# Patient Record
Sex: Female | Born: 1935 | Race: White | Hispanic: No | Marital: Married | Smoking: Never smoker
Health system: Southern US, Community
[De-identification: ages and names within clinical notes are randomized; demographics above are authoritative.]

## PROBLEM LIST (undated history)

## (undated) DIAGNOSIS — I341 Nonrheumatic mitral (valve) prolapse: Secondary | ICD-10-CM

## (undated) DIAGNOSIS — I10 Essential (primary) hypertension: Secondary | ICD-10-CM

## (undated) DIAGNOSIS — E119 Type 2 diabetes mellitus without complications: Secondary | ICD-10-CM

## (undated) DIAGNOSIS — F419 Anxiety disorder, unspecified: Secondary | ICD-10-CM

## (undated) DIAGNOSIS — N181 Chronic kidney disease, stage 1: Secondary | ICD-10-CM

## (undated) DIAGNOSIS — Z8619 Personal history of other infectious and parasitic diseases: Secondary | ICD-10-CM

## (undated) HISTORY — PX: CHOLECYSTECTOMY: SHX55

## (undated) HISTORY — DX: Essential (primary) hypertension: I10

## (undated) HISTORY — DX: Chronic kidney disease, stage 1: N18.1

## (undated) HISTORY — DX: Anxiety disorder, unspecified: F41.9

## (undated) HISTORY — PX: COLON SURGERY: SHX602

## (undated) HISTORY — PX: APPENDECTOMY: SHX54

## (undated) HISTORY — DX: Type 2 diabetes mellitus without complications: E11.9

---

## 2001-08-16 ENCOUNTER — Ambulatory Visit (HOSPITAL_COMMUNITY): Admission: RE | Admit: 2001-08-16 | Discharge: 2001-08-16 | Payer: Self-pay | Admitting: Internal Medicine

## 2001-08-17 ENCOUNTER — Encounter: Payer: Self-pay | Admitting: Internal Medicine

## 2001-08-17 ENCOUNTER — Ambulatory Visit (HOSPITAL_COMMUNITY): Admission: RE | Admit: 2001-08-17 | Discharge: 2001-08-17 | Payer: Self-pay | Admitting: Internal Medicine

## 2001-09-06 ENCOUNTER — Ambulatory Visit (HOSPITAL_COMMUNITY): Admission: RE | Admit: 2001-09-06 | Discharge: 2001-09-06 | Payer: Self-pay | Admitting: *Deleted

## 2001-09-22 ENCOUNTER — Encounter: Payer: Self-pay | Admitting: *Deleted

## 2001-09-22 ENCOUNTER — Ambulatory Visit (HOSPITAL_COMMUNITY): Admission: RE | Admit: 2001-09-22 | Discharge: 2001-09-22 | Payer: Self-pay | Admitting: *Deleted

## 2001-10-10 ENCOUNTER — Inpatient Hospital Stay (HOSPITAL_COMMUNITY): Admission: AD | Admit: 2001-10-10 | Discharge: 2001-10-14 | Payer: Self-pay | Admitting: General Surgery

## 2001-10-10 ENCOUNTER — Encounter: Payer: Self-pay | Admitting: General Surgery

## 2001-12-20 ENCOUNTER — Ambulatory Visit (HOSPITAL_COMMUNITY): Admission: RE | Admit: 2001-12-20 | Discharge: 2001-12-20 | Payer: Self-pay | Admitting: Internal Medicine

## 2001-12-20 ENCOUNTER — Encounter: Payer: Self-pay | Admitting: Internal Medicine

## 2002-12-25 ENCOUNTER — Ambulatory Visit (HOSPITAL_COMMUNITY): Admission: RE | Admit: 2002-12-25 | Discharge: 2002-12-25 | Payer: Self-pay | Admitting: Internal Medicine

## 2002-12-25 ENCOUNTER — Encounter: Payer: Self-pay | Admitting: Internal Medicine

## 2003-06-17 IMAGING — CT CT ABDOMEN W/ CM
1 of 2 series · 13 of 32 positions shown, 19 images · IV contrast (CONTRAST)
Comparison: none

FINDINGS
CLINICAL DATA: CECAL MASS, ABDOMINAL / PELVIC PAIN
CT ABDOMEN AND PELVIS WITH CONTRAST
MULTIPLE CONTIGUOUS AXIAL IMAGES WERE OBTAINED FROM THE LUNG BASES THROUGH THE ABDOMEN AND PELVIS
AT 5 MM COLLIMATION FOLLOWING ADMINISTRATION OF ORAL CONTRAST AND 100 CC OF INTRAVENOUS OMNIPAQUE
300.  NO IMMEDIATE ADVERSE REACTION.   NO COMPARISON FILMS AVAILABLE.
CT ABDOMEN:
MINIMAL INTRAHEPATIC BILIARY FULLNESS IS NOTED.  REMAINDER OF LIVER UNREMARKABLE.  THE SPLEEN,
ADRENAL GLANDS, AND PANCREAS ARE UNREMARKABLE. MILD CORTICAL RENAL THINNING IS NOTED BILATERALLY,
BUT REMAINDER OF KIDNEYS UNREMARKABLE.  NO FREE FLUID OR ENLARGED LYMPH NODES.  MULTIPLE SMALL
GALLSTONES ARE NOTED WITHIN THE GALLBLADDER WHICH IS OTHERWISE UNREMARKABLE.
IMPRESSION
1.  CHOLELITHIASIS WITHOUT EVIDENCE OF CHOLECYSTITIS.
2.  MINIMAL INTRAHEPATIC BILIARY FULLNESS, UPPER LIMITS NORMAL.  NO DEFINITE HEPATIC MASSES.
3.  MILD CORTICAL RENAL THINNING.
CT PELVIS:
THE CECUM IS IRREGULAR AND MAY CORRESPOND TO THE PATIENT'S KNOWN CECAL MASS.  NO DEFINITE EVIDENCE
OF ENLARGED LYMPH NODES.  SMALL TO MODERATE AMOUNT OF FREE FLUID IN THE POSTERIOR INFERIOR PELVIS.
BLADDER GROSSLY UNREMARKABLE.  REMAINDER OF BOWEL GROSSLY UNREMARKABLE.
1.  MILD IRREGULARITY OF THE CECUM LIKELY  REPRESENTING KNOWN CECAL MASS.  NO DEFINITE EVIDENCE OF
LYMPHADENOPATHY.
2.  SMALL TO MODERATE AMOUNT OF FREE FLUID IN THE LOWER PELVIS.

[Series 2581: — · axial · 0.62mm/px · z∈[+1226,+1566]mm · 13 of 80 slices shown, 19 images]
[im 6/80  soft-tissue]
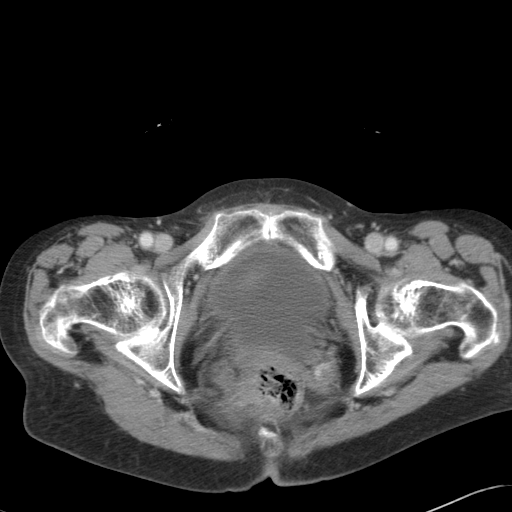
[im 6/80  bone]
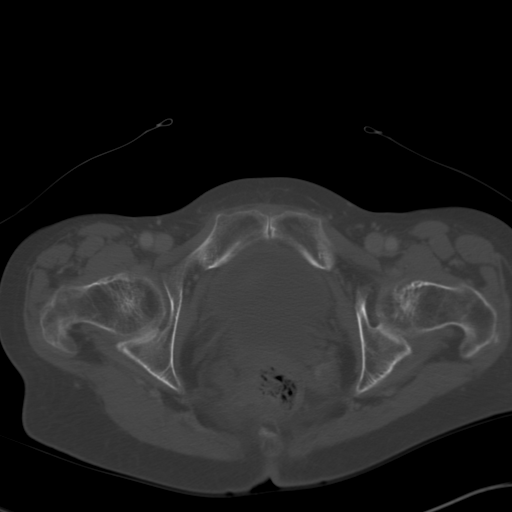
[im 11/80  soft-tissue]
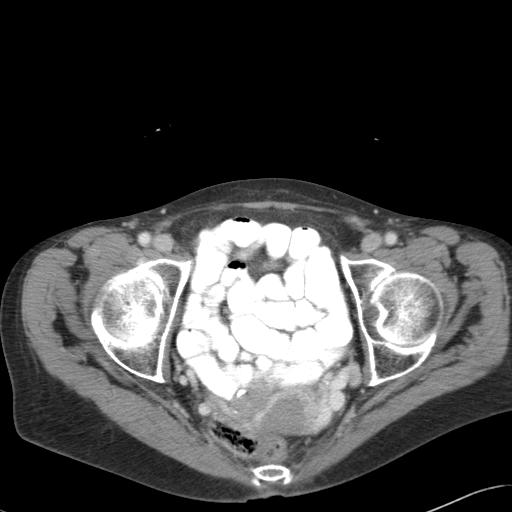
[im 16/80  soft-tissue]
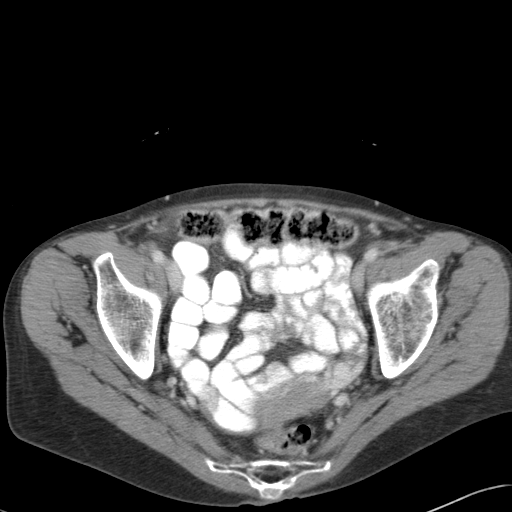
[im 22/80  soft-tissue]
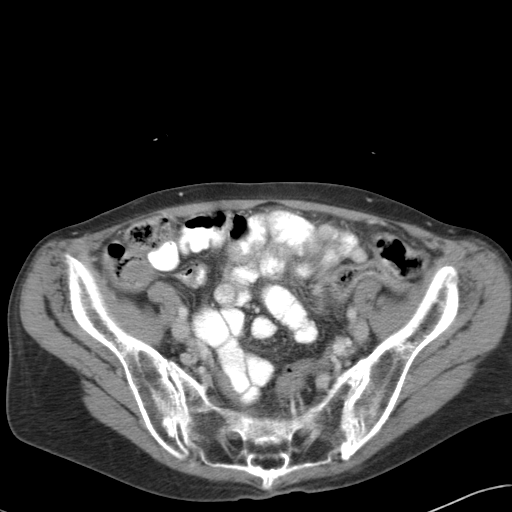
[im 27/80  soft-tissue]
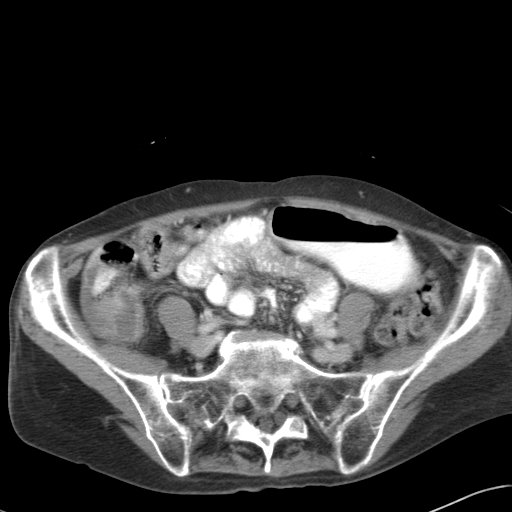
[im 32/80  soft-tissue]
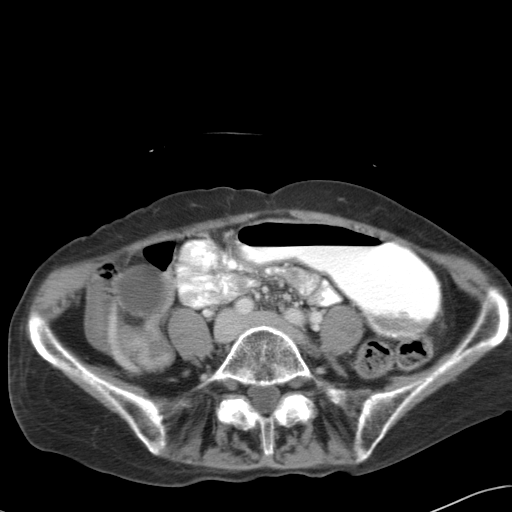
[im 43/80  soft-tissue]
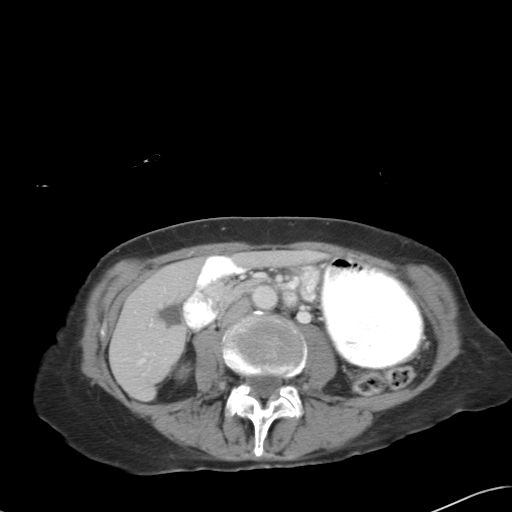
[im 48/80  soft-tissue]
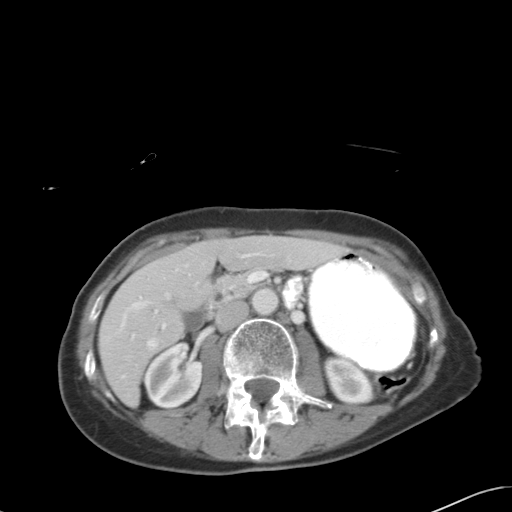
[im 53/80  soft-tissue]
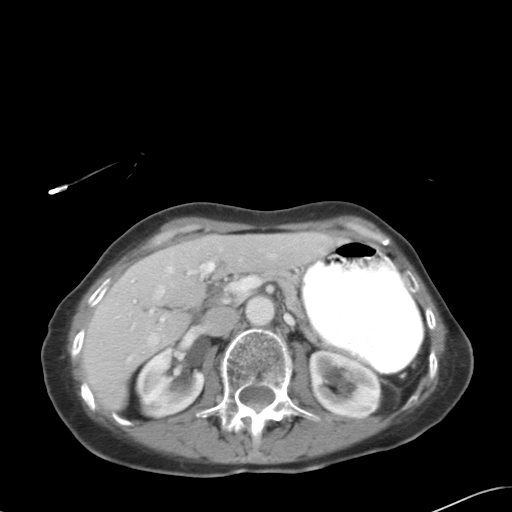
[im 53/80  bone]
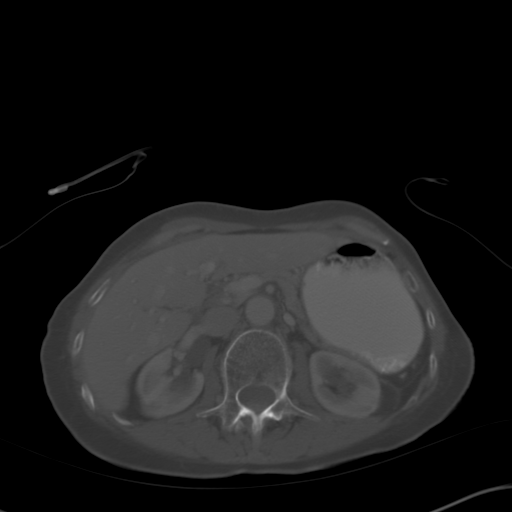
[im 58/80  soft-tissue]
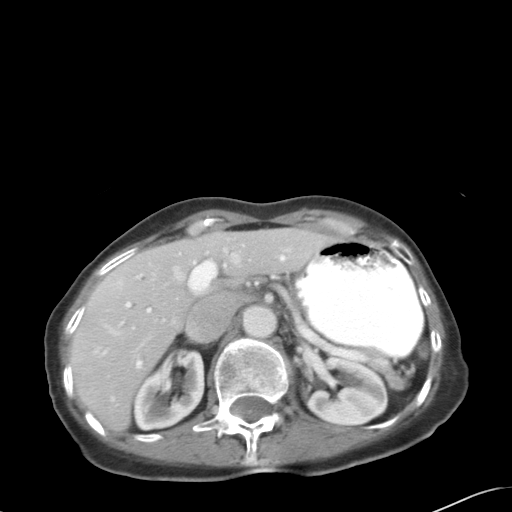
[im 58/80  lung]
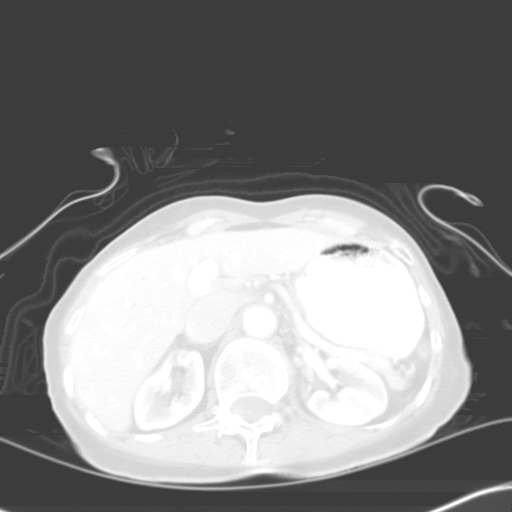
[im 64/80  soft-tissue]
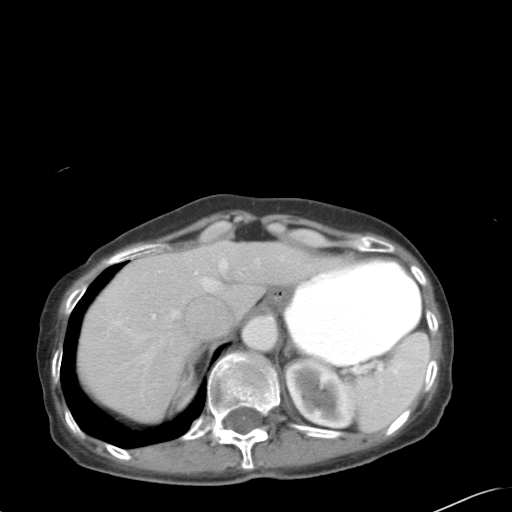
[im 64/80  lung]
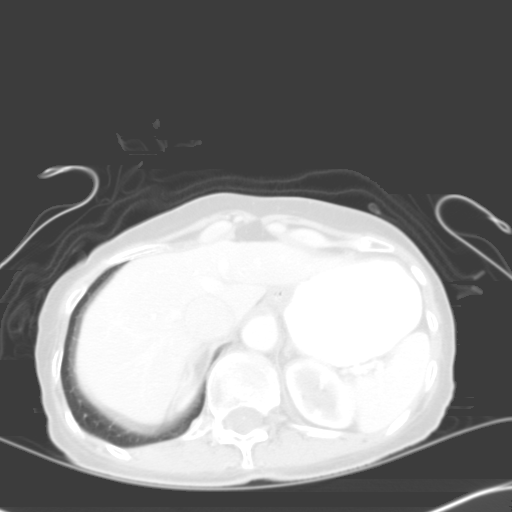
[im 69/80  soft-tissue]
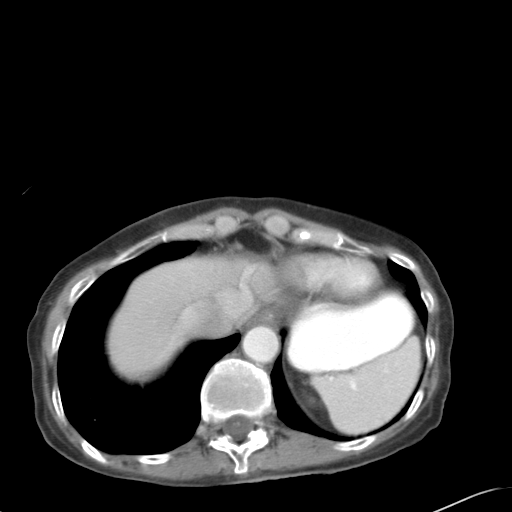
[im 69/80  lung]
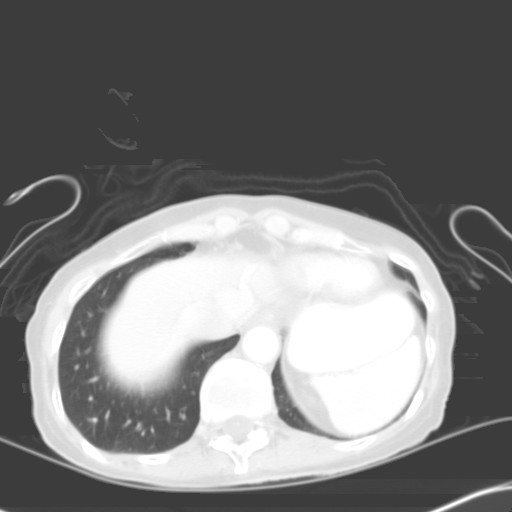
[im 74/80  soft-tissue]
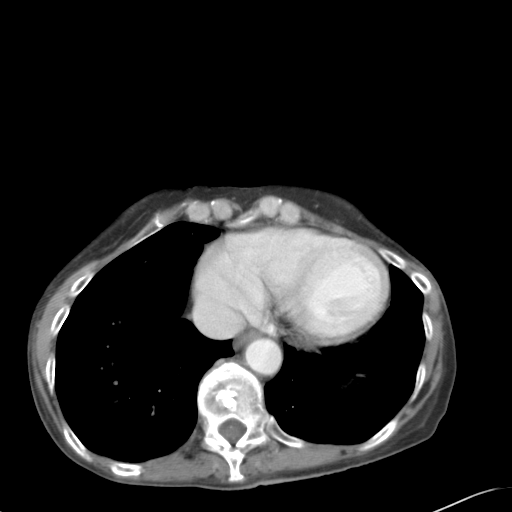
[im 74/80  lung]
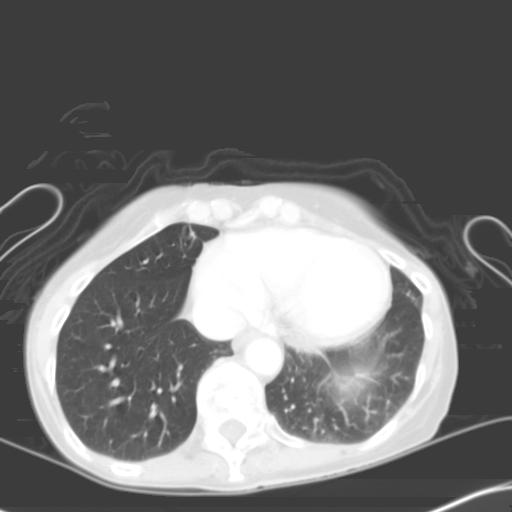

[13 of 32 positions shown; findings below may reference images not displayed]

## 2003-10-23 ENCOUNTER — Ambulatory Visit (HOSPITAL_COMMUNITY): Admission: RE | Admit: 2003-10-23 | Discharge: 2003-10-23 | Payer: Self-pay | Admitting: *Deleted

## 2003-12-26 ENCOUNTER — Ambulatory Visit (HOSPITAL_COMMUNITY): Admission: RE | Admit: 2003-12-26 | Discharge: 2003-12-26 | Payer: Self-pay | Admitting: Internal Medicine

## 2004-09-29 ENCOUNTER — Ambulatory Visit (HOSPITAL_COMMUNITY): Admission: RE | Admit: 2004-09-29 | Discharge: 2004-09-29 | Payer: Self-pay | Admitting: *Deleted

## 2004-12-29 ENCOUNTER — Ambulatory Visit (HOSPITAL_COMMUNITY): Admission: RE | Admit: 2004-12-29 | Discharge: 2004-12-29 | Payer: Self-pay | Admitting: Internal Medicine

## 2005-12-30 ENCOUNTER — Ambulatory Visit (HOSPITAL_COMMUNITY): Admission: RE | Admit: 2005-12-30 | Discharge: 2005-12-30 | Payer: Self-pay | Admitting: Internal Medicine

## 2006-04-28 ENCOUNTER — Ambulatory Visit (HOSPITAL_COMMUNITY): Admission: RE | Admit: 2006-04-28 | Discharge: 2006-04-28 | Payer: Self-pay | Admitting: *Deleted

## 2006-06-01 ENCOUNTER — Ambulatory Visit (HOSPITAL_COMMUNITY): Admission: RE | Admit: 2006-06-01 | Discharge: 2006-06-01 | Payer: Self-pay | Admitting: Ophthalmology

## 2006-06-15 ENCOUNTER — Ambulatory Visit (HOSPITAL_COMMUNITY): Admission: RE | Admit: 2006-06-15 | Discharge: 2006-06-15 | Payer: Self-pay | Admitting: Ophthalmology

## 2007-01-03 ENCOUNTER — Ambulatory Visit (HOSPITAL_COMMUNITY): Admission: RE | Admit: 2007-01-03 | Discharge: 2007-01-03 | Payer: Self-pay | Admitting: Internal Medicine

## 2007-01-18 ENCOUNTER — Ambulatory Visit (HOSPITAL_COMMUNITY): Admission: RE | Admit: 2007-01-18 | Discharge: 2007-01-18 | Payer: Self-pay | Admitting: Internal Medicine

## 2008-01-19 ENCOUNTER — Ambulatory Visit (HOSPITAL_COMMUNITY): Admission: RE | Admit: 2008-01-19 | Discharge: 2008-01-19 | Payer: Self-pay | Admitting: Internal Medicine

## 2008-01-29 ENCOUNTER — Ambulatory Visit (HOSPITAL_COMMUNITY): Admission: RE | Admit: 2008-01-29 | Discharge: 2008-01-29 | Payer: Self-pay | Admitting: Internal Medicine

## 2009-01-21 ENCOUNTER — Ambulatory Visit (HOSPITAL_COMMUNITY): Admission: RE | Admit: 2009-01-21 | Discharge: 2009-01-21 | Payer: Self-pay | Admitting: Internal Medicine

## 2010-01-22 ENCOUNTER — Ambulatory Visit (HOSPITAL_COMMUNITY): Admission: RE | Admit: 2010-01-22 | Discharge: 2010-01-22 | Payer: Self-pay | Admitting: Internal Medicine

## 2010-09-18 NOTE — Procedures (Signed)
Teresa Owen, Teresa Owen                ACCOUNT NO.:  [id number]   MEDICAL RECORD NO.:  [id number]          PATIENT TYPE:  OUT   LOCATION:  RAD                           FACILITY:  [facility]   PHYSICIAN:  Dani Gobble, MD       DATE OF BIRTH:  June 24, 1933   DATE OF PROCEDURE:  DATE OF DISCHARGE:                                  ECHOCARDIOGRAM   REFERRING PHYSICIAN:  Dr. Anne Hahn, Dr. Domingo Sep.   INDICATIONS:  Ms. Teresa Owen is a 77 year old female with known mitral valvular  disease, who was referred for annular electrocardiogram.   The technical quality of the study is adequate.   The aorta is within normal limits at 2.8 cm.   The left atrium is at the upper limits of normal, measured at 4 cm; however,  it appears mildly dilated subjectively.  There are no obvious clots or  masses appreciated, and the patient appeared to be in sinus rhythm during  this procedure.   The intraventricular septum and posterior wall are within normal limits at  1.1 cm and 1 cm, respectively.   The aortic valve appears trial leaflet and pliable with normal leaflet  excursion.  No significant aortic insufficiency is noted.  Doppler  interrogation of the aortic valve is within normal limits.   The mitral valve appears thickened, particularly at the distal portion of  the anterior leaflet with mild-to-moderate pull outs of the anterior leaflet  and possibly the posterior leaflet as well, although this was not as well  visualized.  There is at least moderate and possibly severe mitral  regurgitation which is appreciated as posteriorly and eccentrically directed  jet along the posterior wall of the left atrium.  I cannot exclude the  possibility of pulmonary vein flow reversal on this study.  Doppler  interrogation of the mitral valve is within normal limits.   The pulmonic valve is not well visualized, but there is trivial pulmonic  insufficiency noted.   The tricuspid valve also appears mildly redundant with  mild-to-moderate  tricuspid regurgitation noted.  There is no suggestion of pulmonary  hypertension on this study.   The left ventricle is normal in size with the LVIDD measuring at 4.7 cm and  the LVISD measuring at 3.6 cm.  Overall left ventricular systolic function  at times appears to be mildly reduced to low normal.  This hypokinesis  appears to be global.  It is not confirmed in all views.  There is no  suggestion of diastolic dysfunction.   The right ventricle is not well visualized but appeared to have normal right  ventricular systolic function.  The right atrium was also not well  visualized.  The IVC was mildly dilated but with normal respirophasic  effect.  The interatrial septum was notable for left-to-right bowing,  suggestive of increased left atrial pressures.   IMPRESSION:  1. Mild left atrial enlargement.  2. Moderate-to-severe mitral regurgitation.  3. Thickened distal portion of the anterior leaflet with mild-to-moderate      prolapse of the anterior leaflet and possibly the posterior leaflet as  well.  4. Trivial pulmonic insufficiency.  5. Mild-to-moderate tricuspid regurgitation.  6. Normal left ventricular size with ejection fraction estimated to be      mildly reduced to low-normal.  This hypokinesis appears to be global.      Estimated ejection fraction is 44-55%.  This left ventricular function      is not confirmed in all views.  7. Interatrial septum bows, left to right, suggestive of increased left      atrial pressure.  8. Dilated inferior vena cava with normal respirophasic effect.  9. As compared to prior echo in June, 2005, the ejection fraction may be      somewhat reduced, as last year the ejection fraction was estimated at      50-55%, so this may be slightly reduced but no appreciable change.      Otherwise, there does not appear to be significant change on this      study.        AB/MEDQ  D:  09/29/2004  T:  09/29/2004  Job:   [id number]   cc:   Dorise Hiss, M.D.  [address]  [place]  [place] [address]  Fax: [phone]

## 2010-09-18 NOTE — H&P (Signed)
[facility]  Patient:    Teresa Owen, Teresa Owen Visit Number: [id number] MRN: [id number]          Service Type: MED Location: 3A A322 01 Attending Physician:  Barbaraann Barthel Dictated by:   Barbaraann Barthel, M.D. Admit Date:  10/10/2001   CC:         Roetta Sessions, M.D.  Netta Cedars, M.D.   History and Physical  HISTORY OF PRESENT ILLNESS:  This is a 77 year old white female who was referred by the GI service when the patient had undergone a colonoscopy and was noted to have an abnormality in the area of the appendix.  Biopsies were taken; these were nondiagnostic.  There was a mass effect noted on CT scan and it appeared to be from colonoscopy an intussusceptated appendix with a thickening mass-like effect on CT scan.  The patient also had some gastrointestinal complaints and was noted also to have cholelithiasis.  We had planned for an open right colectomy and cholecystectomy, however, this was delayed due to the need for cardiac workup, as this patient had a rather loud and alarming murmur on clinical examination, which was found to be mitral regurgitation likely secondary to previous rheumatic fever during her childhood.  She was seen and worked up extensively by the cardiology service -- please check their notes regarding this -- and cleared for surgery and she is now admitted for a bowel prep and for surgery in the morning.  The surgery has been discussed in great detail with the patient, discussing complications not limited to, but including, bleeding, infection and anastomotic leaking, and all questions were answered and an informed consent was obtained.  PAST MEDICAL HISTORY:  The patient states that she had herpes zoster 5 to 10 years ago.  She has had no other medical problems that she reports including what was likely rheumatic fever causing the mitral valve disease.  PAST SURGICAL HISTORY:  She has had no previous  surgeries.  ALLERGIES:  She has no known allergies.  MEDICATIONS:  She takes no medications on a regular basis other than what Dr. Netta Cedars is prescribing for her.  PHYSICAL EXAMINATION:  VITAL SIGNS:  She is 5 feet 8 inches, weighs 110 pounds.  Temperature was 97.6, pulse 88 per minute, respirations 20, blood pressure 130/60.  HEENT:  Head is normocephalic.  Eyes:  Extraocular movements are intact. Pupils are equal, round and react to light and accommodation.  NECK:  There is no cervical adenopathy palpated and no bruits auscultated and no thyromegaly.  CHEST:  Clear both anteriorly and posteriorly to auscultation.  CARDIAC:  The patient has a 3/6 systolic murmur appreciated and by me, this has been worked up further by the cardiologists; please see their notes.  BREASTS:  Breasts and axillae are without masses.  Both breasts are atrophic and without any abnormalities palpated.  ABDOMEN:  Soft.  No masses and no visceromegaly are appreciated.  RECTAL:  Examination performed showed guaiac-negative stool.  PELVIC:  Deferred.  EXTREMITIES:  Within normal limits.  REVIEW OF SYSTEMS:  NEUROLOGIC:  The patient has no history of migraines or seizures.  She has a history of anxiety and alterations in her mental state which has attributed to her being essentially disabled.  ENDOCRINE:  No history of diabetes or thyroid disease.  CARDIOLOGY:  The patient is a nonsmoker and nondrinker.  She has mitral valve regurgitation. MUSCULOSKELETAL:  She has a history of osteoporosis.  OB/GYN:  The patient is a  gravida 2, para 2, cesarean 0, abortus 0 female with no past history of breast cancer in any first-line relatives and her last menstrual period was 16 years ago.   GI:  The patient, as stated, has had a recent colonoscopy where the above findings were encountered regarding the appendix and the right lower quadrant.  The patient has no past history of hepatitis,  constipation, diarrhea, bright red rectal bleeding, melena or unexplained weight loss or history of inflammatory bowel disease.  GU:  No history of frequency, dysuria or nephrolithiasis.  LABORATORY AND ACCESSORY DATA:  Laboratory data is still pending at time of this admission.  The cardiology clearance has been obtained.  ADMITTING DIAGNOSES: 1. Cecal mass. 2. Cholelithiasis.  PLAN:  We will plan for an appendectomy or a right colectomy if needed and an open cholecystectomy.  This has been discussed in detail with the patient, as mentioned above, and informed consent was obtained.  We will plan for a mechanical prep of her bowel today and parenteral antibiotics preoperatively to cover her for her surgery. Dictated by:   Barbaraann Barthel, M.D. Attending Physician:  Barbaraann Barthel DD:  10/10/01 TD:  10/11/01 Job: [id number]

## 2010-09-18 NOTE — Procedures (Signed)
NAME:  Teresa Owen, Teresa Owen                          ACCOUNT NO.:  [id number]   MEDICAL RECORD NO.:  [id number]                   PATIENT TYPE:  OUT   LOCATION:  RAD                                  FACILITY:  [facility]   PHYSICIAN:  Darlin Priestly, M.D.             DATE OF BIRTH:  10/22/1933   DATE OF PROCEDURE:  10/23/2003  DATE OF DISCHARGE:                                  ECHOCARDIOGRAM   INDICATION:  [name] is a 77 year old female patient of Dr. Anne Hahn and Dr.  Kem Boroughs with a history of mitral valve prolapse and mitral  regurgitation.  She is now referred for a 2-D echocardiogram to reassess her  mitral valve.   The aorta is within normal limits at 2.7 cm.   The left atrium is mildly enlarged at 4.4 cm.  There are no clots seen;  patient in sinus rhythm during the procedure.   IVS and LVPW are upper limits of normal at 1.1 and 1.1 cm, respectively.   The aortic valve appears to be mildly thickened with no evidence of  significant aortic stenosis or regurgitation.   The anterior mitral valve leaflet is moderately thickened at its distal  portion.  There is prolapse noted at both the anterior and posterior mitral  valve leaflets with at least moderate mitral regurgitation.   Structurally normal tricuspid valve with mild to moderate tricuspid  regurgitation.   Mild pulmonary hypertension, estimated PA pressures are 40 mm Hg.   Left ventricular internal dimension is within normal limits at 4.5 and 3.6  cm, respectively.  There is low normal EF approximately 50-55% with no wall  motion abnormalities noted.   Normal RV size and systolic function.   CONCLUSIONS:  1. Borderline left ventricular hypertrophy with normal left ventricular     systolic function, estimated ejection fraction of 50-55%.  2. Mildly thickened aortic valve with no evidence of significant aortic     stenosis or regurgitation.  3. Moderate thickening of the distal portion of the anterior mitral valve   leaflet with prolapse of the anterior and posterior mitral valve leaflet.     There is at least moderate mitral regurgitation noted.  4. Structurally normal tricuspid valve with mild to moderate tricuspid     regurgitation.  5. Mild pulmonary hypertension, estimated pulmonary artery pressure of 40 mm     Hg.  6. Normal right ventricular size and systolic function.  7. Left atrial enlargement.  8. Suggest a TEE if further mitral valve evaluation is needed.      ___________________________________________                                            Darlin Priestly, M.D.   RHM/MEDQ  D:  10/23/2003  T:  10/24/2003  Job:  [id number]  cc:   Dorise Hiss, M.D.  [address]  [place]  [place] [address]  Fax: [phone]   Dani Gobble, MD  Fax: [phone]

## 2010-09-18 NOTE — Op Note (Signed)
[facility]  Patient:    Teresa Owen, Teresa Owen Visit Number: [id number] MRN: [id number]          Service Type: OUT Location: RAD Attending Physician:  Jonathon Bellows Dictated by:   Roetta Sessions, M.D. Proc. Date: 08/16/01 Admit Date:  08/17/2001   CC:         Dorise Hiss, M.D., [place], [place]   Operative Report  PROCEDURE:  Colonoscopy with biopsy.  INDICATIONS:  The patient is a 77 year old lady who has never had her lower GI tract evaluated. She is referred out of the kindness of Dr. Lucianne Lei for colorectal cancer screening.  Aside from vague intermittent right lower quadrant abdominal pain, she is completely devoid of any lower GI tract symptoms.  There is no history of rectal bleeding, no family history of colorectal neoplasia.  Colonoscopy is now being done to screen her for colorectal cancer.  This approach has been discussed with the patient. Potential risks, benefits, and alternatives have been reviewed with questions answered.  She is agreeable.  Please see my handwritten H&P for more  information.  GASTROENTEROLOGIST:  Roetta Sessions, M.D.  PROCEDURE NOTE:  O2 saturation, blood pressure, pulses of this patient were monitored throughout the entire procedure.  CONSCIOUS SEDATION:  Versed 2 mg IV, Demerol 50 mg IV.  INSTRUMENT: Olympus video chip colonoscope.  FINDINGS:  Digital rectal examination revealed no abnormalities.  ENDOSCOPIC FINDINGS:  Prep was good.  Rectum:  Examination of rectal mucosa including retroflexed view of the anal verge revealed no abnormalities.  COLON:  Colonic mucosa was surveyed from the rectosigmoid junction through the left transverse right colon to the area of the ileocecal valve and cecum.  The colonic mucosa all the way to the cecum appeared normal; however, the cecum was found to be markedly abnormal with what appeared to be the appendix everted and protruding into the lumen of the cecum by a  good 3 to 4 cm.  The mucosa was again everted.  It was somewhat irregular in appearance.  It was hard to palpation with the biopsy forceps.  I briefly attempted to evert this area with gentle pressure on the tip of this abnormal area with the colonoscope.  This was not successful.  Again, this area was quite hard. Please see photos.  It appears that this did represent eversion of the appendix.  The mucosa otherwise around the cecum appeared normal.  This area of protuberant tissue was biopsied three times.  Please see photos.  From this level, the scope was slowly withdrawn.  All previously mentioned mucosal surfaces were again seen.  No other abnormalities were observed.  The patient tolerated the procedure well and was reacted in endoscopy.  IMPRESSION: 1. Normal rectum. 2. Abnormal cecum with apparently inverted hard, irregular-appearing    appendix and periappendiceal mucosa as described above.  See photos,    biopsied. 3. The remainder of the colonic mucosa appeared normal.  The etiology of this peculiar abnormality seen today is not clear.  I suppose this lady may have chronic appendicitis or an occult tumor to produce such a finding.  RECOMMENDATIONS: 1. A CBC today along with a MET-7. 2. We will plan to do a CT of the abdomen and pelvis, follow up on biopsies. 3. It may be best to get this lesion out regardless of CT or biopsy findings. 4. Further recommendations to follow. Dictated by:   Roetta Sessions, M.D. Attending Physician:  Jonathon Bellows DD:  08/16/01 TD:  08/17/01 Job: [id number]

## 2010-09-18 NOTE — Procedures (Signed)
[facility]  Patient:    JEFFREY, VOTH Visit Number: [id number] MRN: [id number]          Service Type: OUT Location: RAD Attending Physician:  Annamaria Boots Dictated by:   Sherral Hammers, M.D. Proc. Date: 09/22/01 Admit Date:  09/22/2001   CC:         Barbaraann Barthel, M.D.   Stress Test  PHYSICIAN:  Sherral Hammers, M.D.  REFERRING PHYSICIAN:  Barbaraann Barthel, M.D.  INDICATIONS:  The patient is a 77 year old white female with a history of questionable mass in the right lower quadrant who was referred for preoperative evaluation prior to her elective surgery.  She was found to have significant mitral regurgitation.  She is referred for a nuclear stress today in anticipation of her upcoming surgery.  PROCEDURE:  Persantine technetium-99 Cardiolite myocardial perfusion:  The patient performed the standard rest/pharmacologic stress protocol.  ELECTROCARDGRAPHIC/HEMODYNAMIC DATA:  The resting blood pressure was 118/50 and the resting pulse was 78 beats per minute.  The patient was asymptomatic throughout the procedure.  Blood pressure remained stable.  Baseline 12-lead electrocardiogram revealed normal sinus rhythm without ischemic changes noted.  There were no further electrocardiographic changes during this procedure or postprocedure.  IMPRESSION: 1. Electrocardiogram negative for ischemia. 2. Sonographic images are pending. Dictated by:   Sherral Hammers, M.D. Attending Physician:  Annamaria Boots DD:  09/22/01 TD:  09/23/01 Job: [id number]

## 2010-09-18 NOTE — Op Note (Signed)
[facility]  Patient:    Teresa Owen, Teresa Owen Visit Number: [id number] MRN: [id number]          Service Type: MED Location: 2A A202 01 Attending Physician:  Barbaraann Barthel Dictated by:   Barbaraann Barthel, M.D. Proc. Date: 10/11/01 Admit Date:  10/10/2001   CC:         Roetta Sessions, M.D.   Operative Report  PREOPERATIVE DIAGNOSES: 1. Introsuscepted appendix. 2. Cholecystitis and cholelithiasis.  POSTOPERATIVE DIAGNOSES: 1. Introsuscepted appendix. 2. Cholecystitis and cholelithiasis.  OPERATION: 1. Partial colectomy (partial cecectomy). 2. Open cholecystectomy.  SURGEON:  Barbaraann Barthel, M.D.  INDICATIONS:  This is a 77 year old white female who had recurrent episodes of right lower quadrant discomfort.  She had undergone previously an endoscopy which revealed what looked to be an inversion of the appendix possibly in the area of the cecum.  Otherwise, the colonoscopy was normal.  CT scan, however, revealed a mass like effect in the area of the cecum.  The patient was scheduled for surgery, however, she was noted on clinical examination to have a rather large systolic murmur, and she was worked up by the cardiology service for her mitral regurgitation which was likely secondary to an old rheumatic fever.  She was also noted on CT scan to have multiple stones within the gallbladder.  A plan was made for an open cholecystectomy and an open right colectomy in the event that this should possibly be a carcinoid tumor or a malignancy of the right colon.  GROSS OPERATIVE FINDINGS:  The patient had no evidence of an appendix external to the cecum, a palpable mass that was loose within the terminal portion of the cecum.  The terminal ileum appeared to be otherwise normal.  The colon was also normal.  Small bowel was also normal.  The patient had multiple small fibroids on the uterus and atrophic ovaries bilaterally with no masses.  The patient had  multiple small stones within the gallbladder.  The liver was normal.  The stomach and pancreas were also palpated and normal.  No other abnormalities apart from what was mentioned were encountered.  Frozen section first rendered the diagnosis of an adenoma that was benign when I gave the pathologist the history of this possibly being an inverted appendix. He said the findings were consistent with this.  At any rate, no malignancy was encountered.  DESCRIPTION OF PROCEDURE:  The patient was placed in the supine position and after the adequate administration of general anesthesia via endotracheal intubation, her entire abdomen was prepped with Betadine solution and draped in the usual manner.  A Foley catheter was aseptically inserted as was an NG tube.  A midline incision was carried out just below the umbilicus, a small infraumbilical incision in the midline.  The cecum was evaluated with the above findings.  No other abnormalities were encountered.  As stated above, I removed the distal portion of the cecum in a totally closed manner using the TA-55 stapler, and I oversewed this with imbricating interrupted 3-0 silk sutures.  We then changed gloves after this procedure and this was sent for frozen section.  I then performed a second incision in the subcostal manner on the right side through skin and subcutaneous tissue and the rectus muscle sheath locating the gallbladder, and this was removed down toward the cystic duct which was ligated on the side of the common bile duct and silver clipped and the cystic artery was likewise identified, triply silver clipped and divided and  the gallbladder was removed using the cautery device.  There was some pesky oozing from the liver bed.  This was controlled with the cautery device and I elected to leave two pieces of Surgicel in this area.  Through a separate stab wound incision I also placed a Jackson-Pratt drain in this area. The wound was  irrigated after checking for hemostasis.  I then closed the lower midline incision using running 0 Polysorb to close the fascia, a running O Polysorb suture to close the peritoneum and interrupted figure-of-eight Polysorb sutures to close the fascia.  The subcu was irrigated.  The skin was approximated with a stapling device.  Again, after checking for hemostasis in the superior incision exactly the same closure was carried out using a running 0 Polysorb suture to close the peritoneum in figure-of-eight interrupted Polysorb sutures to close the fascia.  The subcu was irrigated and the skin was approximated with stapling device.  The drain was sutured in place with 3-0 nylon.  Prior to closure all sponge, needle and instrument counts were found to be correct.  Estimated blood loss was minimal, perhaps 50 cc.  The patient received 2200 cc crystalloid intraoperatively.  There were no complications.   DESCRIPTION OF PROCEDURE: Dictated by:   Barbaraann Barthel, M.D. Attending Physician:  Barbaraann Barthel DD:  10/11/01 TD:  10/13/01 Job: [id number]

## 2010-09-18 NOTE — Discharge Summary (Signed)
[facility]  Patient:    Teresa Owen, Teresa Owen Visit Number: [id number] MRN: [id number]          Service Type: MED Location: 2A A202 01 Attending Physician:  Barbaraann Barthel Dictated by:   Barbaraann Barthel, M.D. Admit Date:  10/10/2001 Discharge Date: 10/14/2001   CC:         Netta Cedars, M.D.  Roetta Sessions, M.D.   Discharge Summary  DIAGNOSES: 1. Cholecystitis. 2. Cholelithiasis. 3. 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  CONSULTATIONS: 1. Netta Cedars, M.D., cardiology. 2. Roetta Sessions, M.D., GI service.  PROCEDURE: 1. On October 11, 2001, partial colectomy (partial cecectomy). 2. Open cholecystectomy.  HISTORY OF PRESENT ILLNESS:  This is a 77 year old white female who had recurrent episodes of right lower quadrant discomfort. She had undergone a previous endoscopy and was noted to have a mass in her cecum and which looked possibly like an inverted appendix or an intussuscepted appendix. She had had no previous abdominal surgery. Otherwise, her colonoscopy appeared to be normal. CT scan, however, revealed a masslike effect in the area of her cecum and the patient was scheduled for surgery on elective basis. She also had some vague dyspeptic complaints and was noted to also have cholelithiasis. We had planned to do both of these procedures at the same time. She, however, had delayed surgery because she was noted on clinical examination to have a substantial systolic murmur, and cardiology worked her up and she was found to have mitral regurgitation likely secondary to rheumatic fever. At any rate, she was readmitted and her bowel prep was done as an inpatient as this patient was highly unreliable. It should be stated that she has been basically disabled for her mental status and I felt it prudent to make sure she underwent the proper care here in the hospital preoperatively. She underwent her surgery on October 11, 2001 at  which time a partial colectomy (partial cecectomy) was performed. Frozen section revealed no malignancy in that area and then an open cholecystectomy was also performed.  HOSPITAL COURSE:  Her postoperative course was very unremarkable. Her diet and activity were advanced as tolerated. Her NG tube was removed on the second postoperative day. Her Jackson-Pratt drain was also removed from her right upper quadrant, and she remained stable from the cardiology point of view as she was monitored with telemetry and followed as well by Dr. Domingo Sep. For notes regarding cardiology please review her notes. She did quite well and was discharged on October 14, 2001. At the time of her discharge, her wound was cleaned as she was moving her bowels and tolerating p.o. well, and all of her wounds remained without any signs of infection. She also had no bouts of shortness of breath or any chest pain, or leg pain, etc. She was discharged on October 14, 2001 and followup arrangements were made.  LABORATORY AND ACCESSORY DATA:  Her white count was 6.3 with an H&H of 13.3 and 38.9. Her metabolic seven was all grossly within normal limits. On October 13, 2001, her white count was 10.1 and 29.9. This remained stable postoperatively. Her CEA level was 1.1.  Her electrocardiogram showed a sinus rhythm with a rate of 82 and multiple atrial premature complexes. As stated, consult Dr. Ledora Bottcher interpretation of these findings. A CT scan revealed cholelithiasis without evidence of cholecystitis, and there was a question of a cecal mass as noted. The patient also had a cardiac Doppler study which showed a normal left ventricular systolic  function as she had mitral valve hockey-stick appearance, with mitral regurgitation and mild to moderate tricuspid regurgitation. The cardiac nuclear scan showed a normal myocardial profusion scan with left ventricular resting ejection fraction of 63%.  Her pathology report revealed a  cecal mass and it was likely an inverted appendix. Gallbladder showed chronic calculus cholecystitis.  DISCHARGE INSTRUCTIONS:  The patient is discharged on a soft diet. She is told to have no nuts, seeds, or popcorn. She has been told to increase her activities as tolerated. She is permitted a shower and tub bath, and she is permitted to go walk up and down the stairs. No heavy lifting, no driving, and no sexual activity. She is told to clean her wound with alcohol three times a day.  DISCHARGE MEDICATIONS:  She is told to take Colace 100 mg daily or every other day as needed. She is told to refrain from taking any aspirin medications at present, and take a multivitamin one capsule daily.  FOLLOWUP:  She is told to follow up with [place] as explained and to follow up as well with Dr. Domingo Sep from the cardiology point of view. Dictated by:   Barbaraann Barthel, M.D. Attending Physician:  Barbaraann Barthel DD:  11/22/01 TD:  11/26/01 Job: [id number]

## 2010-12-14 ENCOUNTER — Other Ambulatory Visit (HOSPITAL_COMMUNITY): Payer: Self-pay | Admitting: Internal Medicine

## 2010-12-14 DIAGNOSIS — Z139 Encounter for screening, unspecified: Secondary | ICD-10-CM

## 2011-01-14 ENCOUNTER — Encounter (HOSPITAL_COMMUNITY): Payer: Self-pay | Admitting: *Deleted

## 2011-01-14 ENCOUNTER — Other Ambulatory Visit (INDEPENDENT_AMBULATORY_CARE_PROVIDER_SITE_OTHER): Payer: Self-pay | Admitting: Internal Medicine

## 2011-01-14 ENCOUNTER — Ambulatory Visit (HOSPITAL_COMMUNITY)
Admission: RE | Admit: 2011-01-14 | Discharge: 2011-01-14 | Disposition: A | Discharge status: Home / Self Care | Payer: Medicare Other | Source: Ambulatory Visit | Attending: Internal Medicine | Admitting: Internal Medicine

## 2011-01-14 ENCOUNTER — Encounter (INDEPENDENT_AMBULATORY_CARE_PROVIDER_SITE_OTHER): Payer: Self-pay | Admitting: Internal Medicine

## 2011-01-14 ENCOUNTER — Encounter (HOSPITAL_COMMUNITY)
Admission: RE | Disposition: A | Discharge status: Home / Self Care | Payer: Self-pay | Source: Ambulatory Visit | Attending: Internal Medicine

## 2011-01-14 DIAGNOSIS — Z1211 Encounter for screening for malignant neoplasm of colon: Secondary | ICD-10-CM

## 2011-01-14 DIAGNOSIS — D126 Benign neoplasm of colon, unspecified: Secondary | ICD-10-CM | POA: Insufficient documentation

## 2011-01-14 DIAGNOSIS — K5730 Diverticulosis of large intestine without perforation or abscess without bleeding: Secondary | ICD-10-CM

## 2011-01-14 DIAGNOSIS — I10 Essential (primary) hypertension: Secondary | ICD-10-CM | POA: Insufficient documentation

## 2011-01-14 DIAGNOSIS — Z79899 Other long term (current) drug therapy: Secondary | ICD-10-CM | POA: Insufficient documentation

## 2011-01-14 DIAGNOSIS — K6389 Other specified diseases of intestine: Secondary | ICD-10-CM

## 2011-01-14 DIAGNOSIS — K5520 Angiodysplasia of colon without hemorrhage: Secondary | ICD-10-CM | POA: Insufficient documentation

## 2011-01-14 HISTORY — DX: Nonrheumatic mitral (valve) prolapse: I34.1

## 2011-01-14 HISTORY — PX: COLONOSCOPY: SHX5424

## 2011-01-14 SURGERY — COLONOSCOPY
Anesthesia: Moderate Sedation

## 2011-01-14 MED ORDER — MIDAZOLAM HCL 5 MG/5ML IJ SOLN
INTRAMUSCULAR | Status: DC | PRN
  Administered 2011-01-14: 1 mg via INTRAVENOUS
  Administered 2011-01-14 (×2): 2 mg via INTRAVENOUS

## 2011-01-14 MED ORDER — STERILE WATER FOR IRRIGATION IR SOLN
Status: DC | PRN
  Administered 2011-01-14: 11:00:00

## 2011-01-14 MED ORDER — MIDAZOLAM HCL 5 MG/5ML IJ SOLN
INTRAMUSCULAR | Status: AC
  Filled 2011-01-14: qty 10

## 2011-01-14 MED ORDER — MEPERIDINE HCL 50 MG/ML IJ SOLN
INTRAMUSCULAR | Status: AC
  Filled 2011-01-14: qty 1

## 2011-01-14 MED ORDER — SODIUM CHLORIDE 0.45 % IV SOLN
Freq: Once | INTRAVENOUS | Status: AC
  Administered 2011-01-14: 10:00:00 via INTRAVENOUS

## 2011-01-14 MED ORDER — MEPERIDINE HCL 50 MG/ML IJ SOLN
INTRAMUSCULAR | Status: DC | PRN
Start: 2011-01-14 — End: 2011-01-14
  Administered 2011-01-14 (×2): 25 mg via INTRAVENOUS

## 2011-01-14 NOTE — H&P (Signed)
Teresa Owen is an 77 y.o. female.   Chief Complaint: Patient is here for screening colonoscopy. HPI: Patient is 77 year old Caucasian female with a colonoscopy. She denies abdominal pain, change in bowel habits, rectal bleeding or melena. She has a good appetite her weight has been stable. Issues last colonoscopy was in April 2003 she was found to have very abnormal appearance and a mass in this region CT she had surgery revealing intussuscepted appendix she underwent septec cecectomy. Family history is negative for colorectal carcinoma.  Past Medical History  Diagnosis Date  . Mitral valve prolapse   . Coronary artery disease   . Hypertension     Past Surgical History  Procedure Date  . Appendectomy   . Cholecystectomy   . Colon surgery     History reviewed. No pertinent family history. Social History:  reports that she has never smoked. She does not have any smokeless tobacco history on file. She reports that she does not drink alcohol or use illicit drugs.  Allergies: No Known Allergies  Medications Prior to Admission  Medication Dose Route Frequency Provider Last Rate Last Dose  . 0.45 % sodium chloride infusion   Intravenous Once Malissa Hippo, MD 20 mL/hr at 01/14/11 0951    . meperidine (DEMEROL) 50 MG/ML injection           . midazolam (VERSED) 5 MG/5ML injection            Medications Prior to Admission  Medication Sig Dispense Refill  . calcium citrate-vitamin D 200-200 MG-UNIT TABS Take 1 tablet by mouth daily.        . metoprolol tartrate (LOPRESSOR) 25 MG tablet Take 25 mg by mouth 2 (two) times daily.        . simvastatin (ZOCOR) 40 MG tablet Take 40 mg by mouth at bedtime.          No results found for this or any previous visit (from the past 48 hour(s)). No results found.  Review of Systems  Constitutional: Negative for weight loss.  Gastrointestinal: Negative for abdominal pain, diarrhea, constipation, blood in stool and melena.    Blood pressure  161/88, pulse 97, temperature 98 F (36.7 C), temperature source Oral, resp. rate 18, height 5\' 8"  (1.727 m), weight 105 lb (47.628 kg), SpO2 100.00%. Physical Exam  Constitutional: She appears well-developed.       thin  HENT:  Mouth/Throat: Oropharynx is clear and moist.  Eyes: Conjunctivae are normal. No scleral icterus.  Neck: No thyromegaly present.  Cardiovascular: Normal rate, regular rhythm and normal heart sounds.        Grade 3/6 systolic murmur at the LLSB and apex  Respiratory: Effort normal and breath sounds normal.  GI: Soft. There is no tenderness.  Musculoskeletal: She exhibits no edema.  Lymphadenopathy:    She has no cervical adenopathy.  Neurological: She is alert.  Skin: Skin is warm and dry.     Assessment/Plan Screening colonoscopy.  Teresa Owen U 01/14/2011, 10:44 AM

## 2011-01-14 NOTE — Op Note (Signed)
COLONOSCOPY PROCEDURE REPORT  PATIENT:  Teresa Owen  MR#:  [id number] Birthdate:  Jun 13, 1933, 77 y.o., female Endoscopist:  Dr. Malissa Hippo, MD Referred By:  Dr. Lia Hopping, MD Procedure Date: 01/14/2011  Procedure:   Colonoscopy  Indications:  Average risk screening colonoscopy. Patient had cecectomy in June 2003 for intussuscepted appendix.  Informed Consent:  Procedure and risks were reviewed with the patient and informed consent was obtained.  Medications:  Demerol 50 mg IV Versed 5 mg IV  Description of procedure:  After a digital rectal exam was performed, that colonoscope was advanced from the anus through the rectum and colon to the area of the cecum, ileocecal valve and appendiceal orifice. The cecum was deeply intubated. These structures were well-seen and photographed for the record. From the level of the cecum and ileocecal valve, the scope was slowly and cautiously withdrawn. The mucosal surfaces were carefully surveyed utilizing scope tip to flexion to facilitate fold flattening as needed. The scope was pulled down into the rectum where a thorough exam including retroflexion was performed.  Findings:   Prep excellent. Scarring at cecum from previous surgery. Single AV malformation at cecum Small  cecal polyp ablated via cold biopsy. 2 small diverticula at sigmoid colon Another small polyp ablated via cold biopsy from ascending colon. Single anal papilla.    Therapeutic/Diagnostic Maneuvers Performed:  See above  Complications:  None  Cecal Withdrawal Time:  15 minutes  Impression:  Examination performed to cecum. Cecal  scarring secondary to previous surgery. 2 small polyps ablated via cold biopsy one from the cecum and second one from ascending colon. 2 small diverticula at sigmoid colon Single anal papilla.  Recommendations:  Resume usual medications. High fiber diet. Physician will contact patient with biopsy results.   Marye Eagen U   01/14/2011 11:20 AM  CC: Dr. Annette Stable. Olena Leatherwood, MD.

## 2011-01-21 ENCOUNTER — Encounter (HOSPITAL_COMMUNITY): Payer: Self-pay | Admitting: Internal Medicine

## 2011-01-22 ENCOUNTER — Encounter (INDEPENDENT_AMBULATORY_CARE_PROVIDER_SITE_OTHER): Payer: Self-pay | Admitting: *Deleted

## 2011-01-26 ENCOUNTER — Ambulatory Visit (HOSPITAL_COMMUNITY)
Admission: RE | Admit: 2011-01-26 | Discharge: 2011-01-26 | Disposition: A | Discharge status: Home / Self Care | Payer: Medicare Other | Source: Ambulatory Visit | Attending: Internal Medicine | Admitting: Internal Medicine

## 2011-01-26 DIAGNOSIS — Z139 Encounter for screening, unspecified: Secondary | ICD-10-CM

## 2011-01-26 DIAGNOSIS — Z1231 Encounter for screening mammogram for malignant neoplasm of breast: Secondary | ICD-10-CM | POA: Insufficient documentation

## 2011-02-01 ENCOUNTER — Other Ambulatory Visit: Payer: Self-pay | Admitting: Internal Medicine

## 2011-02-01 DIAGNOSIS — R928 Other abnormal and inconclusive findings on diagnostic imaging of breast: Secondary | ICD-10-CM

## 2011-02-24 ENCOUNTER — Other Ambulatory Visit: Payer: Self-pay | Admitting: Internal Medicine

## 2011-02-24 ENCOUNTER — Ambulatory Visit (HOSPITAL_COMMUNITY)
Admission: RE | Admit: 2011-02-24 | Discharge: 2011-02-24 | Disposition: A | Discharge status: Home / Self Care | Payer: Medicare Other | Source: Ambulatory Visit | Attending: Internal Medicine | Admitting: Internal Medicine

## 2011-02-24 ENCOUNTER — Encounter (HOSPITAL_COMMUNITY): Payer: Medicare Other

## 2011-02-24 DIAGNOSIS — N63 Unspecified lump in unspecified breast: Secondary | ICD-10-CM | POA: Insufficient documentation

## 2011-02-24 DIAGNOSIS — Z09 Encounter for follow-up examination after completed treatment for conditions other than malignant neoplasm: Secondary | ICD-10-CM | POA: Insufficient documentation

## 2011-02-24 DIAGNOSIS — R928 Other abnormal and inconclusive findings on diagnostic imaging of breast: Secondary | ICD-10-CM

## 2011-05-10 ENCOUNTER — Telehealth: Payer: Self-pay | Admitting: *Deleted

## 2011-05-10 NOTE — Telephone Encounter (Signed)
Pt's husband left message on voicemail asking for a return call.  Pt would like to know if any of our doctors see pt's for MV leaks. She has previously seen Dr. Domingo Sep w/[place], but she has left now. She isn't sure if she wants to see anyone for this or not. She will have primary MD refer her if she decides to see our heart doctor.

## 2011-05-31 DIAGNOSIS — I059 Rheumatic mitral valve disease, unspecified: Secondary | ICD-10-CM

## 2012-01-17 ENCOUNTER — Other Ambulatory Visit (HOSPITAL_COMMUNITY): Payer: Self-pay | Admitting: Internal Medicine

## 2012-01-17 DIAGNOSIS — Z139 Encounter for screening, unspecified: Secondary | ICD-10-CM

## 2012-01-28 ENCOUNTER — Ambulatory Visit (HOSPITAL_COMMUNITY)
Admission: RE | Admit: 2012-01-28 | Discharge: 2012-01-28 | Disposition: A | Discharge status: Home / Self Care | Payer: Medicare Other | Source: Ambulatory Visit | Attending: Internal Medicine | Admitting: Internal Medicine

## 2012-01-28 DIAGNOSIS — Z1231 Encounter for screening mammogram for malignant neoplasm of breast: Secondary | ICD-10-CM | POA: Insufficient documentation

## 2012-01-28 DIAGNOSIS — Z139 Encounter for screening, unspecified: Secondary | ICD-10-CM

## 2012-12-24 IMAGING — US US BREAST*R*
1 series · 3 of 3 positions shown · non-contrast
Comparison: 01/22/2010, 01/21/2009, 01/29/2008, 01/19/2008,
01/18/2007, 01/03/2007

CLINICAL DATA: The patient returns for evaluation of a possible
mass in the right breast noted on recent screening study dated
01/26/2011.

DIGITAL DIAGNOSTIC THE RIGHT MAMMOGRAM  AND RIGHT BREAST
ULTRASOUND:

[Series 1: us breast*right* · 0.07mm/px · 3 of 3 slices shown]
[im 1/3]
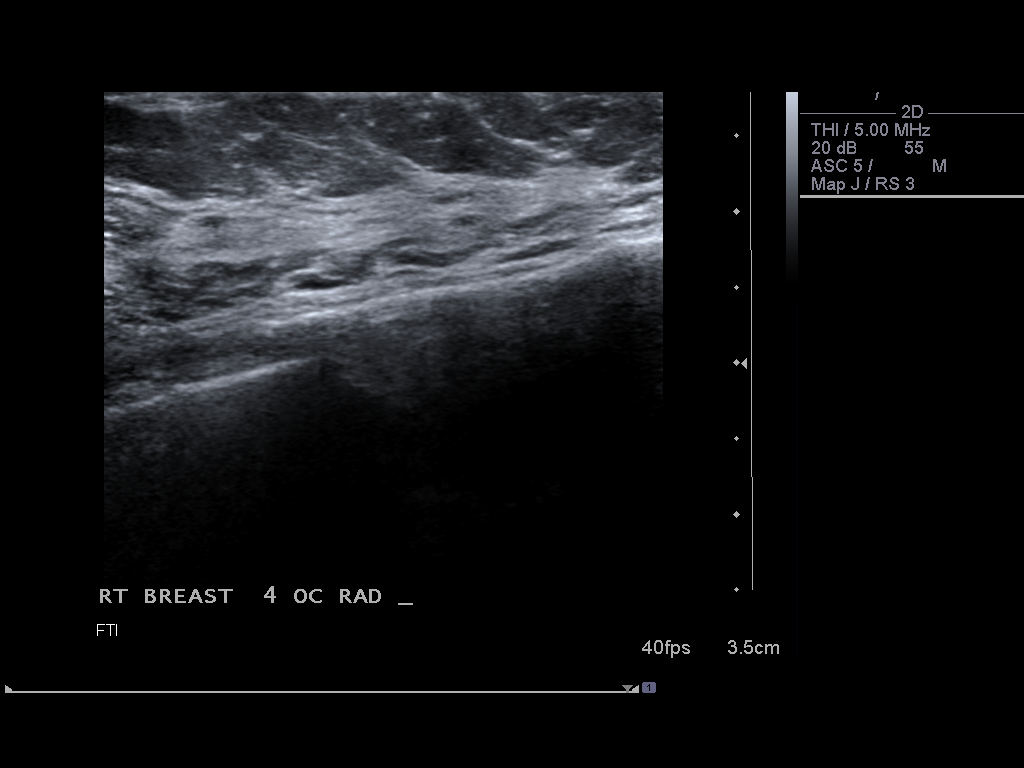
[im 2/3]
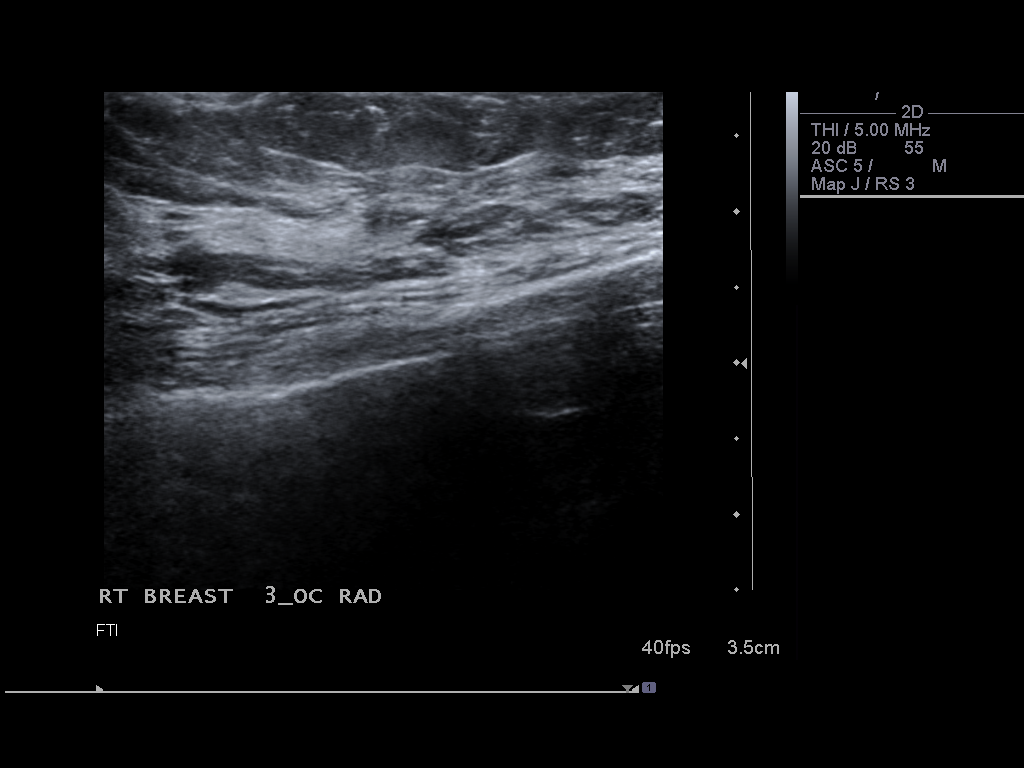
[im 3/3]
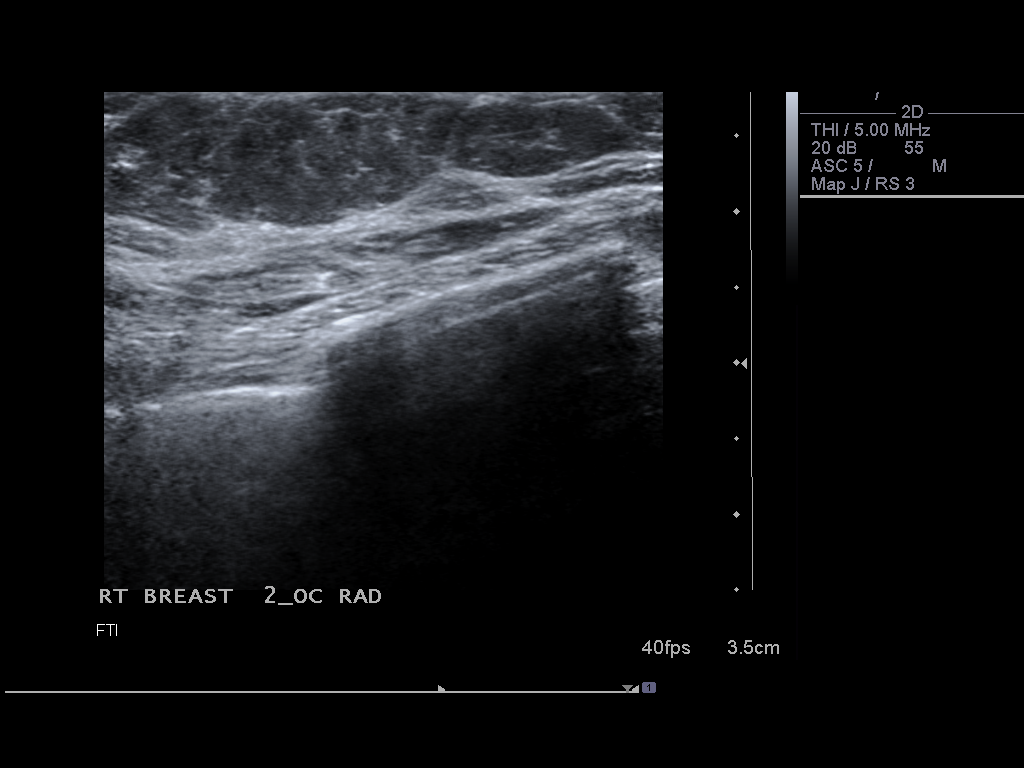

[3 of 3 positions shown; findings below may reference images not displayed]

FINDINGS: Additional views demonstrate no persistent mass or
distortion in the medial aspect of the right breast. This area is
unchanged from prior studies dating back to 2884.

On physical exam, no mass is palpated in the medial aspect of the
right breast.

Ultrasound is performed, showing mixed fibroglandular tissue and
fat throughout the medial half of the right breast with no mass,
distortion or shadowing to suggest malignancy.
IMPRESSION: No mammographic or sonographic evidence of malignancy.  Yearly
screening mammography is suggested.

BI-RADS CATEGORY 1:  Negative.

## 2013-01-02 ENCOUNTER — Other Ambulatory Visit (HOSPITAL_COMMUNITY): Payer: Self-pay | Admitting: Internal Medicine

## 2013-01-02 DIAGNOSIS — Z139 Encounter for screening, unspecified: Secondary | ICD-10-CM

## 2013-01-29 ENCOUNTER — Ambulatory Visit (HOSPITAL_COMMUNITY)
Admission: RE | Admit: 2013-01-29 | Discharge: 2013-01-29 | Disposition: A | Discharge status: Home / Self Care | Payer: Medicare Other | Source: Ambulatory Visit | Attending: Internal Medicine | Admitting: Internal Medicine

## 2013-01-29 DIAGNOSIS — Z1231 Encounter for screening mammogram for malignant neoplasm of breast: Secondary | ICD-10-CM | POA: Insufficient documentation

## 2013-01-29 DIAGNOSIS — Z139 Encounter for screening, unspecified: Secondary | ICD-10-CM

## 2013-12-20 ENCOUNTER — Other Ambulatory Visit (HOSPITAL_COMMUNITY): Payer: Self-pay | Admitting: Internal Medicine

## 2013-12-20 DIAGNOSIS — Z139 Encounter for screening, unspecified: Secondary | ICD-10-CM

## 2014-01-30 ENCOUNTER — Ambulatory Visit (HOSPITAL_COMMUNITY)
Admission: RE | Admit: 2014-01-30 | Discharge: 2014-01-30 | Disposition: A | Discharge status: Home / Self Care | Payer: Medicare Other | Source: Ambulatory Visit | Attending: Internal Medicine | Admitting: Internal Medicine

## 2014-01-30 DIAGNOSIS — Z1231 Encounter for screening mammogram for malignant neoplasm of breast: Secondary | ICD-10-CM | POA: Diagnosis not present

## 2014-01-30 DIAGNOSIS — Z139 Encounter for screening, unspecified: Secondary | ICD-10-CM

## 2015-07-30 DIAGNOSIS — N181 Chronic kidney disease, stage 1: Secondary | ICD-10-CM | POA: Diagnosis not present

## 2015-07-30 DIAGNOSIS — E1121 Type 2 diabetes mellitus with diabetic nephropathy: Secondary | ICD-10-CM | POA: Diagnosis not present

## 2015-07-30 DIAGNOSIS — I10 Essential (primary) hypertension: Secondary | ICD-10-CM | POA: Diagnosis not present

## 2015-07-30 DIAGNOSIS — Z681 Body mass index (BMI) 19 or less, adult: Secondary | ICD-10-CM | POA: Diagnosis not present

## 2015-07-30 DIAGNOSIS — Z0000 Encounter for general adult medical examination without abnormal findings: Secondary | ICD-10-CM | POA: Diagnosis not present

## 2015-09-15 ENCOUNTER — Encounter: Payer: Self-pay | Admitting: Cardiology

## 2015-09-15 ENCOUNTER — Encounter: Payer: Self-pay | Admitting: *Deleted

## 2015-09-15 ENCOUNTER — Ambulatory Visit (INDEPENDENT_AMBULATORY_CARE_PROVIDER_SITE_OTHER): Payer: Medicare Other | Admitting: Cardiology

## 2015-09-15 VITALS — BP 122/88 | HR 84 | Ht 68.0 in | Wt 102.0 lb

## 2015-09-15 DIAGNOSIS — Z136 Encounter for screening for cardiovascular disorders: Secondary | ICD-10-CM | POA: Diagnosis not present

## 2015-09-15 DIAGNOSIS — I341 Nonrheumatic mitral (valve) prolapse: Secondary | ICD-10-CM

## 2015-09-15 DIAGNOSIS — I10 Essential (primary) hypertension: Secondary | ICD-10-CM | POA: Diagnosis not present

## 2015-09-15 DIAGNOSIS — I340 Nonrheumatic mitral (valve) insufficiency: Secondary | ICD-10-CM

## 2015-09-15 NOTE — Patient Instructions (Signed)
Your physician has requested that you have an echocardiogram. Echocardiography is a painless test that uses sound waves to create images of your heart. It provides your doctor with information about the size and shape of your heart and how well your heart's chambers and valves are working. This procedure takes approximately one hour. There are no restrictions for this procedure. - DUE JUST PRIOR TO NEXT OFFICE VISIT IN 6 MONTHS. Continue all current medications. Your physician wants you to follow up in: 6 months.  You will receive a reminder letter in the mail one-two months in advance.  If you don't receive a letter, please call our office to schedule the follow up appointment

## 2015-09-15 NOTE — Progress Notes (Signed)
Cardiology Office Note  Date: 09/15/2015   ID: Teresa Owen, DOB [birth year], MRN [id number]  PCP: Teresa Burly, MD  Consulting Cardiologist: Teresa Lesches, MD   Chief Complaint  Patient presents with  . Mitral Valve Prolapse    History of Present Illness: Teresa Owen is an 82 y.o. female referred for cardiology consultation by Dr. Sherrie Owen.I reviewed the available records which are limited. She has a history of mitral valve prolapse with fairly significant mitral regurgitation based on workup 10 years ago when she was followed by [facility] (Dr. Mathis Owen). Echocardiogram from 2006 reported mild to moderate anterior leaflet prolapse with moderate to severe mitral regurgitation. It appears that she may have had an echocardiogram done through the [facility] in 2015, I am unable to pull up the results.  She states that she has been aware of having a "heart problem" for many years. She does not report any chest pain, has stable NYHA class II dyspnea. She does have a prior history of syncope.   She is married, reportedly functional with basic ADLs. Has an unusual affect, very loquacious, tangential historian, tended to talk over my questions and discussion today.  ECG today shows sinus rhythm with nonspecific T-wave changes.  Past Medical History  Diagnosis Date  . Mitral valve prolapse   . Essential hypertension   . Type 2 diabetes mellitus ([place])   . Anxiety   . CKD (chronic kidney disease), stage I     Past Surgical History  Procedure Laterality Date  . Appendectomy    . Cholecystectomy    . Colon surgery    . Colonoscopy  01/14/2011    Procedure: COLONOSCOPY;  Surgeon: Teresa Houston, MD;  Location: [facility];  Service: Endoscopy;  Laterality: N/A;    Current Outpatient Prescriptions  Medication Sig Dispense Refill  . ALPRAZolam (XANAX) 0.5 MG tablet Take 0.5 mg by mouth at bedtime as needed for anxiety.    Marland Kitchen aspirin 81 MG tablet Take 81 mg by mouth daily.    .  calcium citrate-vitamin D 200-200 MG-UNIT TABS Take 1 tablet by mouth daily.      Marland Kitchen denosumab (PROLIA) 60 MG/ML SOLN injection Inject 60 mg into the skin every 6 (six) months. Administer in upper arm, thigh, or abdomen    . docusate sodium (COLACE) 100 MG capsule Take 100 mg by mouth 2 (two) times daily.    . metFORMIN (GLUCOPHAGE) 500 MG tablet Take by mouth 2 (two) times daily with a meal.    . metoprolol succinate (TOPROL-XL) 50 MG 24 hr tablet Take 50 mg by mouth daily. Take with or immediately following a meal.    . simvastatin (ZOCOR) 40 MG tablet Take 40 mg by mouth at bedtime.       No current facility-administered medications for this visit.   Allergies:  Review of patient's allergies indicates no known allergies.   Social History: The patient  reports that she has never smoked. She has never used smokeless tobacco. She reports that she does not drink alcohol or use illicit drugs.   Family History: The patient's family history is not on file.   ROS:  Please see the history of present illness. Otherwise, complete review of systems is positive for feeling cold most of the time.  All other systems are reviewed and negative.   Physical Exam: VS:  BP 122/88 mmHg  Pulse 84  Ht 5\' 8"  (1.727 m)  Wt 102 lb (46.267 kg)  BMI  15.51 kg/m2  SpO2 93%, BMI Body mass index is 15.51 kg/(m^2).  Wt Readings from Last 3 Encounters:  09/15/15 102 lb (46.267 kg)  01/14/11 105 lb (47.628 kg)    General: Thin elderly woman in no distress, wearing a toboggan. HEENT: Conjunctiva and lids normal, oropharynx clear. Neck: Supple, no elevated JVP or carotid bruits, no thyromegaly. Lungs: Clear to auscultation, nonlabored breathing at rest. Cardiac: Regular rate and rhythm, no S3, 3/6 nearly holosystolic murmur heard at the apex and around the left lateral thorax to the back, no pericardial rub. Abdomen: Soft, nontender, bowel sounds present, no guarding or rebound. Extremities: No pitting edema, distal  pulses 2+. Skin: Warm and dry. Musculoskeletal: Kyphosis noted. Neuropsychiatric: Alert and oriented x3, affect usual as outlined above.  ECG: No old tracings for comparison.  Other Studies Reviewed Today:  Echocardiogram 09/29/2004 [facility]): IMPRESSION: 1. Mild left atrial enlargement. 2. Moderate-to-severe mitral regurgitation. 3. Thickened distal portion of the anterior leaflet with mild-to-moderate  prolapse of the anterior leaflet and possibly the posterior leaflet as  well. 4. Trivial pulmonic insufficiency. 5. Mild-to-moderate tricuspid regurgitation. 6. Normal left ventricular size with ejection fraction estimated to be  mildly reduced to low-normal. This hypokinesis appears to be global.  Estimated ejection fraction is 44-55%. This left ventricular function  is not confirmed in all views. 7. Interatrial septum bows, left to right, suggestive of increased left  atrial pressure. 8. Dilated inferior vena cava with normal respirophasic effect. 9. As compared to prior echo in June, 2005, the ejection fraction may be  somewhat reduced, as last year the ejection fraction was estimated at  50-55%, so this may be slightly reduced but no appreciable change.  Otherwise, there does not appear to be significant change on this  study.  Assessment and Plan:  1. History of mitral valve prolapse with significant mitral regurgitation based on workup 10 years ago, almost certainly this has progressed over time. She has a prominent murmur on examination consistent with mitral regurgitation. Symptomatically she has been relatively stable however with NYHA class II dyspnea with basic ADLs. We discussed the likelihood that she has had progressive valvular heart disease, she is not interested in any invasive evaluation or surgery. I am requesting her most recent echocardiogram from [facility] (2015?) - will review and most likely update prior to her  next anticipated clinic visit.  2. Essential hypertension, on Toprol-XL. Blood pressure is adequately controlled today.  3. Hyperlipidemia, on Zocor.  Current medicines were reviewed with the patient today.   Orders Placed This Encounter  Procedures  . EKG 12-Lead  . ECHOCARDIOGRAM COMPLETE    Disposition: FU with me in 6 months.   Signed, Satira Sark, MD, [facility] 09/15/2015 8:55 AM    [facility], [place], [place] [address] Phone: [phone]; Fax: [phone]

## 2015-10-15 DIAGNOSIS — Z7982 Long term (current) use of aspirin: Secondary | ICD-10-CM | POA: Diagnosis not present

## 2015-10-15 DIAGNOSIS — E7800 Pure hypercholesterolemia, unspecified: Secondary | ICD-10-CM | POA: Diagnosis not present

## 2015-10-15 DIAGNOSIS — M8588 Other specified disorders of bone density and structure, other site: Secondary | ICD-10-CM | POA: Diagnosis not present

## 2015-10-15 DIAGNOSIS — M1990 Unspecified osteoarthritis, unspecified site: Secondary | ICD-10-CM | POA: Diagnosis not present

## 2015-10-15 DIAGNOSIS — I10 Essential (primary) hypertension: Secondary | ICD-10-CM | POA: Diagnosis not present

## 2015-10-15 DIAGNOSIS — M810 Age-related osteoporosis without current pathological fracture: Secondary | ICD-10-CM | POA: Diagnosis not present

## 2015-10-15 DIAGNOSIS — Z79899 Other long term (current) drug therapy: Secondary | ICD-10-CM | POA: Diagnosis not present

## 2015-10-15 DIAGNOSIS — E119 Type 2 diabetes mellitus without complications: Secondary | ICD-10-CM | POA: Diagnosis not present

## 2015-10-15 DIAGNOSIS — Z780 Asymptomatic menopausal state: Secondary | ICD-10-CM | POA: Diagnosis not present

## 2015-10-27 DIAGNOSIS — M810 Age-related osteoporosis without current pathological fracture: Secondary | ICD-10-CM | POA: Diagnosis not present

## 2015-11-05 DIAGNOSIS — N181 Chronic kidney disease, stage 1: Secondary | ICD-10-CM | POA: Diagnosis not present

## 2015-11-05 DIAGNOSIS — E1121 Type 2 diabetes mellitus with diabetic nephropathy: Secondary | ICD-10-CM | POA: Diagnosis not present

## 2015-11-05 DIAGNOSIS — I10 Essential (primary) hypertension: Secondary | ICD-10-CM | POA: Diagnosis not present

## 2015-11-05 DIAGNOSIS — Z681 Body mass index (BMI) 19 or less, adult: Secondary | ICD-10-CM | POA: Diagnosis not present

## 2015-12-02 DIAGNOSIS — Z8619 Personal history of other infectious and parasitic diseases: Secondary | ICD-10-CM

## 2015-12-02 HISTORY — DX: Personal history of other infectious and parasitic diseases: Z86.19

## 2015-12-19 DIAGNOSIS — B028 Zoster with other complications: Secondary | ICD-10-CM | POA: Diagnosis not present

## 2016-01-19 ENCOUNTER — Encounter (INDEPENDENT_AMBULATORY_CARE_PROVIDER_SITE_OTHER): Payer: Self-pay | Admitting: *Deleted

## 2016-01-21 ENCOUNTER — Telehealth (INDEPENDENT_AMBULATORY_CARE_PROVIDER_SITE_OTHER): Payer: Self-pay | Admitting: *Deleted

## 2016-01-21 ENCOUNTER — Encounter (INDEPENDENT_AMBULATORY_CARE_PROVIDER_SITE_OTHER): Payer: Self-pay | Admitting: *Deleted

## 2016-01-21 MED ORDER — PEG 3350-KCL-NA BICARB-NACL 420 G PO SOLR: 4000.0000 mL | Freq: Once | ORAL | 0 refills | Status: AC

## 2016-01-21 NOTE — Telephone Encounter (Signed)
agree

## 2016-01-21 NOTE — Telephone Encounter (Signed)
Referring MD/PCP: hasanaj   Procedure: tcs  Reason/Indication:  Hx polyps  Has patient had this procedure before?  Yes, 2012  If so, when, by whom and where?    Is there a family history of colon cancer?  no  Who?  What age when diagnosed?    Is patient diabetic?   yes      Does patient have prosthetic heart valve or mechanical valve?  no  Do you have a pacemaker?  no  Has patient ever had endocarditis? no  Has patient had joint replacement within last 12 months?  no  Does patient tend to be constipated or take laxatives? yes  Does patient have a history of alcohol/drug use?  no  Is patient on Coumadin, Plavix and/or Aspirin? yes  Medications: stool aoftener prn, asa 81 mg daily, calcium 600 mg plus vit d, metformin 500 mg bid, metoprolol 50 mg bid, simvastatin 40 mg daily  Allergies: nkda  Medication Adjustment: asa 2 days, hold metformin evening before & morning of  Procedure date & time: 01/29/16 at 200

## 2016-01-21 NOTE — Telephone Encounter (Signed)
Above noted 

## 2016-01-21 NOTE — Telephone Encounter (Signed)
Patient needs trilyte 

## 2016-01-21 NOTE — Telephone Encounter (Signed)
I called [name] to triage and schedule TCS -- patient states her only means of transportation is [employer] -- I spoke to nurse in ENDO and hospital policy is patients need responsible driver to drop off and pick up, not a transportation system. [name] is aware of this and she will have her PCP get a doctor in [place] to do colonoscopy.

## 2016-01-22 ENCOUNTER — Other Ambulatory Visit (INDEPENDENT_AMBULATORY_CARE_PROVIDER_SITE_OTHER): Payer: Self-pay | Admitting: *Deleted

## 2016-01-22 DIAGNOSIS — Z86010 Personal history of colonic polyps: Secondary | ICD-10-CM | POA: Insufficient documentation

## 2016-01-29 ENCOUNTER — Encounter (HOSPITAL_COMMUNITY)
Admission: RE | Disposition: A | Discharge status: Home / Self Care | Payer: Self-pay | Source: Ambulatory Visit | Attending: Internal Medicine

## 2016-01-29 ENCOUNTER — Ambulatory Visit (HOSPITAL_COMMUNITY)
Admission: RE | Admit: 2016-01-29 | Discharge: 2016-01-29 | Disposition: A | Discharge status: Home / Self Care | Payer: Medicare Other | Source: Ambulatory Visit | Attending: Internal Medicine | Admitting: Internal Medicine

## 2016-01-29 ENCOUNTER — Encounter (HOSPITAL_COMMUNITY): Payer: Self-pay | Admitting: *Deleted

## 2016-01-29 DIAGNOSIS — Z09 Encounter for follow-up examination after completed treatment for conditions other than malignant neoplasm: Secondary | ICD-10-CM | POA: Diagnosis not present

## 2016-01-29 DIAGNOSIS — Z79899 Other long term (current) drug therapy: Secondary | ICD-10-CM | POA: Diagnosis not present

## 2016-01-29 DIAGNOSIS — D127 Benign neoplasm of rectosigmoid junction: Secondary | ICD-10-CM | POA: Diagnosis not present

## 2016-01-29 DIAGNOSIS — E1122 Type 2 diabetes mellitus with diabetic chronic kidney disease: Secondary | ICD-10-CM | POA: Diagnosis not present

## 2016-01-29 DIAGNOSIS — K6289 Other specified diseases of anus and rectum: Secondary | ICD-10-CM

## 2016-01-29 DIAGNOSIS — Z7984 Long term (current) use of oral hypoglycemic drugs: Secondary | ICD-10-CM | POA: Insufficient documentation

## 2016-01-29 DIAGNOSIS — Z7982 Long term (current) use of aspirin: Secondary | ICD-10-CM | POA: Diagnosis not present

## 2016-01-29 DIAGNOSIS — K5730 Diverticulosis of large intestine without perforation or abscess without bleeding: Secondary | ICD-10-CM | POA: Diagnosis not present

## 2016-01-29 DIAGNOSIS — Z8619 Personal history of other infectious and parasitic diseases: Secondary | ICD-10-CM | POA: Diagnosis not present

## 2016-01-29 DIAGNOSIS — K644 Residual hemorrhoidal skin tags: Secondary | ICD-10-CM | POA: Insufficient documentation

## 2016-01-29 DIAGNOSIS — Z9049 Acquired absence of other specified parts of digestive tract: Secondary | ICD-10-CM | POA: Diagnosis not present

## 2016-01-29 DIAGNOSIS — I341 Nonrheumatic mitral (valve) prolapse: Secondary | ICD-10-CM | POA: Insufficient documentation

## 2016-01-29 DIAGNOSIS — I129 Hypertensive chronic kidney disease with stage 1 through stage 4 chronic kidney disease, or unspecified chronic kidney disease: Secondary | ICD-10-CM | POA: Insufficient documentation

## 2016-01-29 DIAGNOSIS — D125 Benign neoplasm of sigmoid colon: Secondary | ICD-10-CM | POA: Insufficient documentation

## 2016-01-29 DIAGNOSIS — N181 Chronic kidney disease, stage 1: Secondary | ICD-10-CM | POA: Insufficient documentation

## 2016-01-29 DIAGNOSIS — F419 Anxiety disorder, unspecified: Secondary | ICD-10-CM | POA: Diagnosis not present

## 2016-01-29 DIAGNOSIS — Z86010 Personal history of colonic polyps: Secondary | ICD-10-CM | POA: Diagnosis not present

## 2016-01-29 DIAGNOSIS — Z1211 Encounter for screening for malignant neoplasm of colon: Secondary | ICD-10-CM | POA: Diagnosis not present

## 2016-01-29 HISTORY — DX: Personal history of other infectious and parasitic diseases: Z86.19

## 2016-01-29 HISTORY — PX: COLONOSCOPY: SHX5424

## 2016-01-29 LAB — GLUCOSE, CAPILLARY: Glucose-Capillary: 91 mg/dL (ref 65–99)

## 2016-01-29 SURGERY — COLONOSCOPY
Anesthesia: Moderate Sedation

## 2016-01-29 MED ORDER — MIDAZOLAM HCL 5 MG/5ML IJ SOLN
INTRAMUSCULAR | Status: DC | PRN
  Administered 2016-01-29: 2 mg via INTRAVENOUS
  Administered 2016-01-29: 1 mg via INTRAVENOUS
  Administered 2016-01-29: 2 mg via INTRAVENOUS

## 2016-01-29 MED ORDER — MIDAZOLAM HCL 5 MG/5ML IJ SOLN
INTRAMUSCULAR | Status: AC
  Filled 2016-01-29: qty 10

## 2016-01-29 MED ORDER — MEPERIDINE HCL 50 MG/ML IJ SOLN
INTRAMUSCULAR | Status: AC
  Filled 2016-01-29: qty 1

## 2016-01-29 MED ORDER — SODIUM CHLORIDE 0.9 % IV SOLN
INTRAVENOUS | Status: DC
  Administered 2016-01-29: 13:00:00 via INTRAVENOUS

## 2016-01-29 MED ORDER — MEPERIDINE HCL 50 MG/ML IJ SOLN
INTRAMUSCULAR | Status: DC | PRN
  Administered 2016-01-29 (×2): 25 mg via INTRAVENOUS

## 2016-01-29 MED ORDER — STERILE WATER FOR IRRIGATION IR SOLN
Status: DC | PRN
  Administered 2016-01-29: 2.5 mL

## 2016-01-29 NOTE — Discharge Instructions (Signed)
Resume usual medications and diet. No driving for 24 hours. Physician will call with biopsy results.  Colonoscopy, Care After These instructions give you information on caring for yourself after your procedure. Your doctor may also give you more specific instructions. Call your doctor if you have any problems or questions after your procedure. HOME CARE  Do not drive for 24 hours.  Do not sign important papers or use machinery for 24 hours.  You may shower.  You may go back to your usual activities, but go slower for the first 24 hours.  Take rest breaks often during the first 24 hours.  Walk around or use warm packs on your belly (abdomen) if you have belly cramping or gas.  Drink enough fluids to keep your pee (urine) clear or pale yellow.  Resume your normal diet. Avoid heavy or fried foods.  Avoid drinking alcohol for 24 hours or as told by your doctor.  Only take medicines as told by your doctor. If a tissue sample (biopsy) was taken during the procedure:   Do not take aspirin or blood thinners for 7 days, or as told by your doctor.  Do not drink alcohol for 7 days, or as told by your doctor.  Eat soft foods for the first 24 hours. GET HELP IF: You still have a small amount of blood in your poop (stool) 2-3 days after the procedure. GET HELP RIGHT AWAY IF:  You have more than a small amount of blood in your poop.  You see clumps of tissue (blood clots) in your poop.  Your belly is puffy (swollen).  You feel sick to your stomach (nauseous) or throw up (vomit).  You have a fever.  You have belly pain that gets worse and medicine does not help. MAKE SURE YOU:  Understand these instructions.  Will watch your condition.  Will get help right away if you are not doing well or get worse.   This information is not intended to replace advice given to you by your health care provider. Make sure you discuss any questions you have with your health care provider.     Document Released: 05/22/2010 Document Revised: 04/24/2013 Document Reviewed: 12/25/2012 Elsevier Interactive Patient Education [employer].

## 2016-01-29 NOTE — H&P (Signed)
Teresa Owen is an 82 y.o. female.   Chief Complaint: Patient is here for colonoscopy. HPI: Patient is 82 year old Caucasian female who underwent colonoscopy 5 years ago for screening purposes and was noted to have 2 tubular adenomas. She is returning for surveillance colonoscopy. She denies abdominal pain change in bowel habits or rectal bleeding. Patient used to weigh 115 pounds. She's been gradually losing weight. This year she only has lost 5 pounds. She says she just cannot eat too much. Family history is negative for CRC.  Past Medical History:  Diagnosis Date  . Anxiety   . CKD (chronic kidney disease), stage I   . Essential hypertension   . History of shingles 12/2015  . Mitral valve prolapse   . Type 2 diabetes mellitus (Teresa Owen)     Past Surgical History:  Procedure Laterality Date  . APPENDECTOMY    . CHOLECYSTECTOMY    . COLON SURGERY    . COLONOSCOPY  01/14/2011   Procedure: COLONOSCOPY;  Surgeon: Rogene Houston, MD;  [facility];  Service: Endoscopy;  Laterality: N/A;    History reviewed. No pertinent family history. Social History:  reports that she has never smoked. She has never used smokeless tobacco. She reports that she does not drink alcohol or use drugs.  Allergies: No Known Allergies  Medications Prior to Admission  Medication Sig Dispense Refill  . ALPRAZolam (XANAX) 0.5 MG tablet Take 0.5 mg by mouth at bedtime as needed for anxiety.    Marland Kitchen aspirin 81 MG tablet Take 81 mg by mouth daily.    . calcium citrate-vitamin D 200-200 MG-UNIT TABS Take 1 tablet by mouth daily.      Marland Kitchen docusate sodium (COLACE) 100 MG capsule Take 100 mg by mouth 2 (two) times daily.    . metFORMIN (GLUCOPHAGE) 500 MG tablet Take by mouth 2 (two) times daily with a meal.    . metoprolol succinate (TOPROL-XL) 50 MG 24 hr tablet Take 50 mg by mouth daily. Take with or immediately following a meal.    . simvastatin (ZOCOR) 40 MG tablet Take 40 mg by mouth at bedtime.      Marland Kitchen  denosumab (PROLIA) 60 MG/ML SOLN injection Inject 60 mg into the skin every 6 (six) months. Administer in upper arm, thigh, or abdomen      No results found for this or any previous visit (from the past 33 hour(s)). No results found.  ROS  Blood pressure 139/75, pulse (!) 107, temperature 97.9 F (36.6 C), temperature source Oral, resp. rate 20, height 5\' 8"  (1.727 m), weight 98 lb (44.5 kg), SpO2 98 %. Physical Exam  Constitutional:  Well-developed thin Caucasian female in NAD  HENT:  Mouth/Throat: Oropharynx is clear and moist.  Eyes: Conjunctivae are normal. No scleral icterus.  Neck: No thyromegaly present.  Cardiovascular: Normal rate, regular rhythm and normal heart sounds.   No murmur heard. Respiratory: Effort normal and breath sounds normal.  GI:  Abdomen is flat with normal midline scar. Abdomen is soft with mild tenderness at RUQ. No organomegaly or masses.  Lymphadenopathy:    She has no cervical adenopathy.     Assessment/Plan History of colonic adenomas. Surveillance colonoscopy.  Hildred Laser, MD 01/29/2016, 2:14 PM

## 2016-01-29 NOTE — Op Note (Signed)
[facility] Patient Name: Teresa Owen Procedure Date: 01/29/2016 2:14 PM MRN: [id number] Date of Birth: 26-Jan-1934 Attending MD: Hildred Laser , MD CSN: [id number] Age: 82 Admit Type: Outpatient Procedure:                Colonoscopy Indications:              High risk colon cancer surveillance: Personal                            history of colonic polyps Providers:                Hildred Laser, MD, Lurline Del, RN Referring MD:             Stoney Bang MD, MD Medicines:                Meperidine 50 mg IV, Midazolam 5 mg IV Complications:            No immediate complications. Estimated Blood Loss:     Estimated blood loss was minimal. Procedure:                Pre-Anesthesia Assessment:                           - Prior to the procedure, a History and Physical                            was performed, and patient medications and                            allergies were reviewed. The patient's tolerance of                            previous anesthesia was also reviewed. The risks                            and benefits of the procedure and the sedation                            options and risks were discussed with the patient.                            All questions were answered, and informed consent                            was obtained. Prior Anticoagulants: The patient                            last took aspirin 2 days prior to the procedure.                            ASA Grade Assessment: II - A patient with mild                            systemic disease. After reviewing the risks and  benefits, the patient was deemed in satisfactory                            condition to undergo the procedure.                           After obtaining informed consent, the colonoscope                            was passed under direct vision. Throughout the                            procedure, the patient's blood pressure, pulse, and           oxygen saturations were monitored continuously. The                            [id number]) was introduced through the                            anus and advanced to the the cecum, identified by                            appendiceal orifice and ileocecal valve. The                            colonoscopy was performed without difficulty. The                            patient tolerated the procedure well. The quality                            of the bowel preparation was adequate. The                            ileocecal valve, appendiceal orifice, and rectum                            were photographed. Scope In: 2:27:21 PM Scope Out: 2:45:57 PM Scope Withdrawal Time: 0 hours 10 minutes 12 seconds  Total Procedure Duration: 0 hours 18 minutes 36 seconds  Findings:      A single medium-mouthed diverticulum was found in the sigmoid colon.      A 3 mm polyp was found in the recto-sigmoid colon. The polyp was       sessile. The polyp was removed with a cold snare. Resection and       retrieval were complete.      External hemorrhoids were found during retroflexion. The hemorrhoids       were small.      Anal papilla(e) were hypertrophied. Impression:               - Diverticulosis in the sigmoid colon.                           - One 3 mm polyp at the recto-sigmoid colon,  removed with a cold snare. Resected and retrieved.                           - External hemorrhoids.                           - Anal papilla(e) were hypertrophied. Moderate Sedation:      Moderate (conscious) sedation was administered by the endoscopy nurse       and supervised by the endoscopist. The following parameters were       monitored: oxygen saturation, heart rate, blood pressure, CO2       capnography and response to care. Total physician intraservice time was       25 minutes. Recommendation:           - Patient has a contact number available for                             emergencies. The signs and symptoms of potential                            delayed complications were discussed with the                            patient. Return to normal activities tomorrow.                            Written discharge instructions were provided to the                            patient.                           - Resume previous diet today.                           - Continue present medications.                           - Await pathology results.                           - No repeat colonoscopy due to age. Procedure Code(s):        --- Professional ---                           [phone], Colonoscopy, flexible; with removal of                            tumor(s), polyp(s), or other lesion(s) by snare                            technique                           99152, Moderate sedation services provided by the  same physician or other qualified health care                            professional performing the diagnostic or                            therapeutic service that the sedation supports,                            requiring the presence of an independent trained                            observer to assist in the monitoring of the                            patient's level of consciousness and physiological                            status; initial 15 minutes of intraservice time,                            patient age 41 years or older                           [phone], Moderate sedation services; each additional                            15 minutes intraservice time Diagnosis Code(s):        --- Professional ---                           Z86.010, Personal history of colonic polyps                           K64.4, Residual hemorrhoidal skin tags                           D12.7, Benign neoplasm of rectosigmoid junction                           K62.89, Other specified diseases of anus and rectum                            K57.30, Diverticulosis of large intestine without                            perforation or abscess without bleeding CPT copyright 2016 [employer]. All rights reserved. The codes documented in this report are preliminary and upon coder review may  be revised to meet current compliance requirements. Hildred Laser, MD Hildred Laser, MD 01/29/2016 2:54:14 PM This report has been signed electronically. Number of Addenda: 0

## 2016-02-04 ENCOUNTER — Encounter (HOSPITAL_COMMUNITY): Payer: Self-pay | Admitting: Internal Medicine

## 2016-02-05 DIAGNOSIS — E1122 Type 2 diabetes mellitus with diabetic chronic kidney disease: Secondary | ICD-10-CM | POA: Diagnosis not present

## 2016-02-05 DIAGNOSIS — I10 Essential (primary) hypertension: Secondary | ICD-10-CM | POA: Diagnosis not present

## 2016-02-05 DIAGNOSIS — E784 Other hyperlipidemia: Secondary | ICD-10-CM | POA: Diagnosis not present

## 2016-02-05 DIAGNOSIS — Z1389 Encounter for screening for other disorder: Secondary | ICD-10-CM | POA: Diagnosis not present

## 2016-02-05 DIAGNOSIS — Z0000 Encounter for general adult medical examination without abnormal findings: Secondary | ICD-10-CM | POA: Diagnosis not present

## 2016-02-09 DIAGNOSIS — H35363 Drusen (degenerative) of macula, bilateral: Secondary | ICD-10-CM | POA: Diagnosis not present

## 2016-02-18 ENCOUNTER — Other Ambulatory Visit: Payer: Self-pay

## 2016-02-18 ENCOUNTER — Telehealth: Payer: Self-pay | Admitting: *Deleted

## 2016-02-18 ENCOUNTER — Ambulatory Visit (INDEPENDENT_AMBULATORY_CARE_PROVIDER_SITE_OTHER): Payer: Medicare Other

## 2016-02-18 DIAGNOSIS — I340 Nonrheumatic mitral (valve) insufficiency: Secondary | ICD-10-CM

## 2016-02-18 NOTE — Telephone Encounter (Signed)
Patient informed. Copy sent to PCP °

## 2016-02-18 NOTE — Telephone Encounter (Signed)
-----   Message from Teresa Sark, MD sent at 02/18/2016  4:26 PM EDT ----- Results reviewed. Study shows mitral valve prolapse with fairly significant mitral regurgitation as noted by prior assessments. We will continue to follow her as arranged, she did not want to pursue further workup of her mitral valve based on my initial encounter with her. A copy of this test should be forwarded to Neale Burly, MD.

## 2016-03-05 NOTE — Progress Notes (Signed)
Cardiology Office Note  Date: 03/08/2016   ID: Teresa Owen, DOB Jan 08, 1934, MRN [id number]  PCP: Teresa Burly, MD  Primary Cardiologist: Teresa Lesches, MD   Chief Complaint  Patient presents with  . Mitral Valve Prolapse    History of Present Illness: Teresa Owen is an 82 y.o. female last seen in May. She presents for a follow-up visit. Reports no major change in functional capacity, NYHA class II dyspnea, no palpitations or syncope.  She had a follow-up echocardiogram in October for reevaluation of mitral valve prolapse and mitral regurgitation. We discussed the results in detail today. She remains adamant that she does not want to pursue any invasive evaluation or valve surgery.  I reviewed her medications which are outlined below. She continues to follow with Dr. Sherrie Owen on a three-month basis.  Past Medical History:  Diagnosis Date  . Anxiety   . CKD (chronic kidney disease), stage I   . Essential hypertension   . History of shingles 12/2015  . Mitral valve prolapse   . Type 2 diabetes mellitus ([place])     Past Surgical History:  Procedure Laterality Date  . APPENDECTOMY    . CHOLECYSTECTOMY    . COLON SURGERY    . COLONOSCOPY  01/14/2011   Procedure: COLONOSCOPY;  Surgeon: Teresa Houston, MD;  [facility];  Service: Endoscopy;  Laterality: N/A;  . COLONOSCOPY N/A 01/29/2016   Procedure: COLONOSCOPY;  Surgeon: Teresa Houston, MD;  [facility];  Service: Endoscopy;  Laterality: N/A;  200    Current Outpatient Prescriptions  Medication Sig Dispense Refill  . ALPRAZolam (XANAX) 0.5 MG tablet Take 0.5 mg by mouth at bedtime as needed for anxiety.    Marland Kitchen aspirin 81 MG tablet Take 81 mg by mouth daily.    . calcium citrate-vitamin D 200-200 MG-UNIT TABS Take 1 tablet by mouth daily.      Marland Kitchen denosumab (PROLIA) 60 MG/ML SOLN injection Inject 60 mg into the skin every 6 (six) months. Administer in upper arm, thigh, or abdomen    . docusate  sodium (COLACE) 100 MG capsule Take 100 mg by mouth 2 (two) times daily.    Marland Kitchen gabapentin (NEURONTIN) 100 MG capsule Take 100 mg by mouth 3 (three) times daily.    . metFORMIN (GLUCOPHAGE) 500 MG tablet Take by mouth 2 (two) times daily with a meal.    . metoprolol succinate (TOPROL-XL) 50 MG 24 hr tablet Take 50 mg by mouth daily. Take with or immediately following a meal.    . simvastatin (ZOCOR) 40 MG tablet Take 40 mg by mouth at bedtime.       No current facility-administered medications for this visit.    Allergies:  Patient has no known allergies.   Social History: The patient  reports that she has never smoked. She has never used smokeless tobacco. She reports that she does not drink alcohol or use drugs.   ROS:  Please see the history of present illness. Otherwise, complete review of systems is positive for anxiety.  All other systems are reviewed and negative.   Physical Exam: VS:  BP 100/76   Pulse 98   Ht 5\' 8"  (1.727 m)   Wt 103 lb (46.7 kg)   SpO2 99%   BMI 15.66 kg/m , BMI Body mass index is 15.66 kg/m.  Wt Readings from Last 3 Encounters:  03/08/16 103 lb (46.7 kg)  01/29/16 98 lb (44.5 kg)  09/15/15 102  lb (46.3 kg)    General: Thin elderly woman in no distress, again wearing a toboggan. HEENT: Conjunctiva and lids normal, oropharynx clear. Neck: Supple, no elevated JVP or carotid bruits, no thyromegaly. Lungs: Clear to auscultation, nonlabored breathing at rest. Cardiac: Regular rate and rhythm, no S3, 3/6 holosystolic murmur heard at the apex and around the left lateral thorax to the back, no pericardial rub. Abdomen: Soft, nontender, bowel sounds present, no guarding or rebound. Extremities: No pitting edema, distal pulses 2+.  ECG: I personally reviewed the tracing from 09/15/2015 which showed sinus rhythm with R' in lead V1 and V2, nonspecific T-wave changes.  Other Studies Reviewed Today:  Echocardiogram 02/18/2016: Study Conclusions  - Left  ventricle: The cavity size was mildly dilated. Wall   thickness was normal. Systolic function was normal. The estimated   ejection fraction was in the range of 60% to 65%. Wall motion was   normal; there were no regional wall motion abnormalities.   Features are consistent with a pseudonormal left ventricular   filling pattern, with concomitant abnormal relaxation and   increased filling pressure (grade 2 diastolic dysfunction). - Mitral valve: Mildly thickened mitral leaflets with prolapse of   the anterior leaflet (possibly A2 +/- A3) and associated moderate   to severe, posterior directed mitral regurgitation. There was   moderate to severe regurgitation directed posteriorly. - Left atrium: The atrium was severely dilated. - Right atrium: The atrium was moderately dilated. Central venous   pressure (est): 8 mm Hg. - Tricuspid valve: There was moderate regurgitation. - Pulmonary arteries: Systolic pressure was moderately to severely   increased. PA peak pressure: 59 mm Hg (S). - Pericardium, extracardiac: A small pericardial effusion was   identified posterior to the heart.  Impressions:  - Mild LV chamber dilatation with LVEF 60-65%. Grade 2 diastolic   dysfunction. Severe left atrial enlargement. Mildly thickened   mitral leaflets with prolapse of the anterior leaflet (possibly   A2 +/- A3) and associated moderate to severe, posteriorly   directed mitral regurgitation. Moderate tricuspid regurgitation   with evidence of moderate to severe pulmonary hypertension, PASP   59 mmHg. Moderate right atrial enlargement. Small posterior   pericardial effusion.  Lexiscan Myoview 05/10/2014 [facility]): No diagnostic ST segment changes, no significant myocardial perfusion defects, LVEF 75%.  Assessment and Plan:  1. Anterior mitral valve prolapse as outlined above associated with moderate to severe, posteriorly directed mitral regurgitation and severe left atrial enlargement. She also has  moderate to severe pulmonary hypertension with PASP 50 mmHg. This has been a long-term issue based on record review, and she adamantly declines any further invasive workup or valve surgery. She remains at risk for heart failure, endocarditis and cardiac arrhythmia. Continue beta blocker therapy and keep follow-up with Dr. Sherrie Owen.  2. Essential hypertension, blood pressure is well controlled today.  Current medicines were reviewed with the patient today.  Disposition: Follow-up in one year.  Signed, Satira Sark, MD, [facility] 03/08/2016 11:10 AM    [facility] [place], [place], [place] [address] Phone: [phone]; Fax: [phone]

## 2016-03-08 ENCOUNTER — Encounter: Payer: Self-pay | Admitting: Cardiology

## 2016-03-08 ENCOUNTER — Ambulatory Visit (INDEPENDENT_AMBULATORY_CARE_PROVIDER_SITE_OTHER): Payer: Medicare Other | Admitting: Cardiology

## 2016-03-08 VITALS — BP 100/76 | HR 98 | Ht 68.0 in | Wt 103.0 lb

## 2016-03-08 DIAGNOSIS — I341 Nonrheumatic mitral (valve) prolapse: Secondary | ICD-10-CM | POA: Diagnosis not present

## 2016-03-08 DIAGNOSIS — I340 Nonrheumatic mitral (valve) insufficiency: Secondary | ICD-10-CM

## 2016-03-08 NOTE — Patient Instructions (Signed)

## 2016-03-17 ENCOUNTER — Other Ambulatory Visit: Payer: Medicare Other

## 2016-05-10 DIAGNOSIS — Z79899 Other long term (current) drug therapy: Secondary | ICD-10-CM | POA: Diagnosis not present

## 2016-05-10 DIAGNOSIS — Z7984 Long term (current) use of oral hypoglycemic drugs: Secondary | ICD-10-CM | POA: Diagnosis not present

## 2016-05-10 DIAGNOSIS — I4891 Unspecified atrial fibrillation: Secondary | ICD-10-CM | POA: Diagnosis not present

## 2016-05-10 DIAGNOSIS — J449 Chronic obstructive pulmonary disease, unspecified: Secondary | ICD-10-CM | POA: Diagnosis not present

## 2016-05-10 DIAGNOSIS — B9620 Unspecified Escherichia coli [E. coli] as the cause of diseases classified elsewhere: Secondary | ICD-10-CM | POA: Diagnosis not present

## 2016-05-10 DIAGNOSIS — J90 Pleural effusion, not elsewhere classified: Secondary | ICD-10-CM | POA: Diagnosis not present

## 2016-05-10 DIAGNOSIS — R1111 Vomiting without nausea: Secondary | ICD-10-CM | POA: Diagnosis not present

## 2016-05-10 DIAGNOSIS — I5033 Acute on chronic diastolic (congestive) heart failure: Secondary | ICD-10-CM | POA: Diagnosis not present

## 2016-05-10 DIAGNOSIS — Z8249 Family history of ischemic heart disease and other diseases of the circulatory system: Secondary | ICD-10-CM | POA: Diagnosis not present

## 2016-05-10 DIAGNOSIS — I110 Hypertensive heart disease with heart failure: Secondary | ICD-10-CM | POA: Diagnosis not present

## 2016-05-10 DIAGNOSIS — N179 Acute kidney failure, unspecified: Secondary | ICD-10-CM | POA: Diagnosis not present

## 2016-05-10 DIAGNOSIS — R000 Tachycardia, unspecified: Secondary | ICD-10-CM | POA: Diagnosis not present

## 2016-05-10 DIAGNOSIS — E785 Hyperlipidemia, unspecified: Secondary | ICD-10-CM | POA: Diagnosis not present

## 2016-05-10 DIAGNOSIS — R278 Other lack of coordination: Secondary | ICD-10-CM | POA: Diagnosis not present

## 2016-05-10 DIAGNOSIS — N178 Other acute kidney failure: Secondary | ICD-10-CM | POA: Diagnosis not present

## 2016-05-10 DIAGNOSIS — M6281 Muscle weakness (generalized): Secondary | ICD-10-CM | POA: Diagnosis not present

## 2016-05-10 DIAGNOSIS — I480 Paroxysmal atrial fibrillation: Secondary | ICD-10-CM | POA: Diagnosis not present

## 2016-05-10 DIAGNOSIS — E1140 Type 2 diabetes mellitus with diabetic neuropathy, unspecified: Secondary | ICD-10-CM | POA: Diagnosis not present

## 2016-05-10 DIAGNOSIS — E440 Moderate protein-calorie malnutrition: Secondary | ICD-10-CM | POA: Diagnosis not present

## 2016-05-10 DIAGNOSIS — N390 Urinary tract infection, site not specified: Secondary | ICD-10-CM | POA: Diagnosis not present

## 2016-05-10 DIAGNOSIS — R1312 Dysphagia, oropharyngeal phase: Secondary | ICD-10-CM | POA: Diagnosis not present

## 2016-05-10 DIAGNOSIS — I509 Heart failure, unspecified: Secondary | ICD-10-CM | POA: Diagnosis not present

## 2016-05-10 DIAGNOSIS — R197 Diarrhea, unspecified: Secondary | ICD-10-CM | POA: Diagnosis not present

## 2016-05-10 DIAGNOSIS — R918 Other nonspecific abnormal finding of lung field: Secondary | ICD-10-CM | POA: Diagnosis not present

## 2016-05-10 DIAGNOSIS — J310 Chronic rhinitis: Secondary | ICD-10-CM | POA: Diagnosis not present

## 2016-05-10 DIAGNOSIS — R2681 Unsteadiness on feet: Secondary | ICD-10-CM | POA: Diagnosis not present

## 2016-05-10 DIAGNOSIS — E860 Dehydration: Secondary | ICD-10-CM | POA: Diagnosis not present

## 2016-05-14 DIAGNOSIS — Z66 Do not resuscitate: Secondary | ICD-10-CM | POA: Diagnosis not present

## 2016-05-14 DIAGNOSIS — R279 Unspecified lack of coordination: Secondary | ICD-10-CM | POA: Diagnosis not present

## 2016-05-14 DIAGNOSIS — I5033 Acute on chronic diastolic (congestive) heart failure: Secondary | ICD-10-CM | POA: Diagnosis not present

## 2016-05-14 DIAGNOSIS — N178 Other acute kidney failure: Secondary | ICD-10-CM | POA: Diagnosis not present

## 2016-05-14 DIAGNOSIS — R0902 Hypoxemia: Secondary | ICD-10-CM | POA: Diagnosis not present

## 2016-05-14 DIAGNOSIS — Z79899 Other long term (current) drug therapy: Secondary | ICD-10-CM | POA: Diagnosis not present

## 2016-05-14 DIAGNOSIS — I4891 Unspecified atrial fibrillation: Secondary | ICD-10-CM | POA: Diagnosis not present

## 2016-05-14 DIAGNOSIS — B9620 Unspecified Escherichia coli [E. coli] as the cause of diseases classified elsewhere: Secondary | ICD-10-CM | POA: Diagnosis not present

## 2016-05-14 DIAGNOSIS — J441 Chronic obstructive pulmonary disease with (acute) exacerbation: Secondary | ICD-10-CM | POA: Diagnosis not present

## 2016-05-14 DIAGNOSIS — I509 Heart failure, unspecified: Secondary | ICD-10-CM | POA: Diagnosis not present

## 2016-05-14 DIAGNOSIS — E440 Moderate protein-calorie malnutrition: Secondary | ICD-10-CM | POA: Diagnosis not present

## 2016-05-14 DIAGNOSIS — E1140 Type 2 diabetes mellitus with diabetic neuropathy, unspecified: Secondary | ICD-10-CM | POA: Diagnosis not present

## 2016-05-14 DIAGNOSIS — N390 Urinary tract infection, site not specified: Secondary | ICD-10-CM | POA: Diagnosis not present

## 2016-05-14 DIAGNOSIS — R0602 Shortness of breath: Secondary | ICD-10-CM | POA: Diagnosis not present

## 2016-05-14 DIAGNOSIS — E785 Hyperlipidemia, unspecified: Secondary | ICD-10-CM | POA: Diagnosis not present

## 2016-05-14 DIAGNOSIS — I480 Paroxysmal atrial fibrillation: Secondary | ICD-10-CM | POA: Diagnosis not present

## 2016-05-14 DIAGNOSIS — J310 Chronic rhinitis: Secondary | ICD-10-CM | POA: Diagnosis not present

## 2016-05-14 DIAGNOSIS — E860 Dehydration: Secondary | ICD-10-CM | POA: Diagnosis not present

## 2016-05-14 DIAGNOSIS — Z7901 Long term (current) use of anticoagulants: Secondary | ICD-10-CM | POA: Diagnosis not present

## 2016-05-14 DIAGNOSIS — J449 Chronic obstructive pulmonary disease, unspecified: Secondary | ICD-10-CM | POA: Diagnosis not present

## 2016-05-14 DIAGNOSIS — I110 Hypertensive heart disease with heart failure: Secondary | ICD-10-CM | POA: Diagnosis not present

## 2016-05-14 DIAGNOSIS — M6281 Muscle weakness (generalized): Secondary | ICD-10-CM | POA: Diagnosis not present

## 2016-05-14 DIAGNOSIS — R404 Transient alteration of awareness: Secondary | ICD-10-CM | POA: Diagnosis not present

## 2016-05-14 DIAGNOSIS — R278 Other lack of coordination: Secondary | ICD-10-CM | POA: Diagnosis not present

## 2016-05-14 DIAGNOSIS — N179 Acute kidney failure, unspecified: Secondary | ICD-10-CM | POA: Diagnosis not present

## 2016-05-14 DIAGNOSIS — Z7984 Long term (current) use of oral hypoglycemic drugs: Secondary | ICD-10-CM | POA: Diagnosis not present

## 2016-05-14 DIAGNOSIS — R2681 Unsteadiness on feet: Secondary | ICD-10-CM | POA: Diagnosis not present

## 2016-05-14 DIAGNOSIS — Z7401 Bed confinement status: Secondary | ICD-10-CM | POA: Diagnosis not present

## 2016-05-14 DIAGNOSIS — E870 Hyperosmolality and hypernatremia: Secondary | ICD-10-CM | POA: Diagnosis not present

## 2016-05-14 DIAGNOSIS — R1312 Dysphagia, oropharyngeal phase: Secondary | ICD-10-CM | POA: Diagnosis not present

## 2016-05-14 DIAGNOSIS — J9691 Respiratory failure, unspecified with hypoxia: Secondary | ICD-10-CM | POA: Diagnosis not present

## 2016-05-14 DIAGNOSIS — E119 Type 2 diabetes mellitus without complications: Secondary | ICD-10-CM | POA: Diagnosis not present

## 2016-05-20 DIAGNOSIS — Z66 Do not resuscitate: Secondary | ICD-10-CM | POA: Diagnosis not present

## 2016-05-20 DIAGNOSIS — Z79899 Other long term (current) drug therapy: Secondary | ICD-10-CM | POA: Diagnosis not present

## 2016-05-20 DIAGNOSIS — E870 Hyperosmolality and hypernatremia: Secondary | ICD-10-CM | POA: Diagnosis not present

## 2016-05-20 DIAGNOSIS — Z7984 Long term (current) use of oral hypoglycemic drugs: Secondary | ICD-10-CM | POA: Diagnosis not present

## 2016-05-20 DIAGNOSIS — J9691 Respiratory failure, unspecified with hypoxia: Secondary | ICD-10-CM | POA: Diagnosis not present

## 2016-05-20 DIAGNOSIS — E860 Dehydration: Secondary | ICD-10-CM | POA: Diagnosis not present

## 2016-05-20 DIAGNOSIS — R0602 Shortness of breath: Secondary | ICD-10-CM | POA: Diagnosis not present

## 2016-05-20 DIAGNOSIS — J441 Chronic obstructive pulmonary disease with (acute) exacerbation: Secondary | ICD-10-CM | POA: Diagnosis not present

## 2016-05-20 DIAGNOSIS — E119 Type 2 diabetes mellitus without complications: Secondary | ICD-10-CM | POA: Diagnosis not present

## 2016-05-20 DIAGNOSIS — Z7901 Long term (current) use of anticoagulants: Secondary | ICD-10-CM | POA: Diagnosis not present

## 2016-06-03 DEATH — deceased
